# Patient Record
Sex: Female | Born: 2000 | Race: Black or African American | Hispanic: No | Marital: Single | Smoking: Never smoker
Health system: Southern US, Community
[De-identification: ages and names within clinical notes are randomized; demographics above are authoritative.]

## PROBLEM LIST (undated history)

## (undated) DIAGNOSIS — T7840XA Allergy, unspecified, initial encounter: Secondary | ICD-10-CM

## (undated) DIAGNOSIS — E559 Vitamin D deficiency, unspecified: Secondary | ICD-10-CM

## (undated) DIAGNOSIS — F419 Anxiety disorder, unspecified: Secondary | ICD-10-CM

## (undated) DIAGNOSIS — L309 Dermatitis, unspecified: Secondary | ICD-10-CM

## (undated) DIAGNOSIS — Z9109 Other allergy status, other than to drugs and biological substances: Secondary | ICD-10-CM

## (undated) DIAGNOSIS — B009 Herpesviral infection, unspecified: Secondary | ICD-10-CM

## (undated) HISTORY — DX: Herpesviral infection, unspecified: B00.9

## (undated) HISTORY — DX: Anxiety disorder, unspecified: F41.9

## (undated) HISTORY — DX: Vitamin D deficiency, unspecified: E55.9

## (undated) HISTORY — DX: Allergy, unspecified, initial encounter: T78.40XA

---
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
## 2021-07-02 NOTE — Progress Notes (Signed)
New Patient Office Visit  Subjective:  Patient ID: Marie Wilcox, female    DOB: May 27, 2001  Age: 21 y.o. MRN: [id number]  CC:  Chief Complaint  Patient presents with   Establish Care    Pt is fasting, pt would like a physcial with blood, pt does not have an ob     HPI Marie Wilcox presents for new patient visit to establish care.  Introduced to Designer, jewellery role and practice setting.  All questions answered.  Discussed provider/patient relationship and expectations. She has no concerns today, other than wanting a physical with her labs updated.   Depression screen [facility] 2/9 07/03/2021  Decreased Interest 0  Down, Depressed, Hopeless 0  PHQ - 2 Score 0  Altered sleeping 0  Tired, decreased energy 0  Change in appetite 0  Feeling bad or failure about yourself  0  Trouble concentrating 0  Moving slowly or fidgety/restless 0  Suicidal thoughts 0  PHQ-9 Score 0  Difficult doing work/chores Not difficult at all   GAD 7 : Generalized Anxiety Score 07/03/2021  Nervous, Anxious, on Edge 0  Control/stop worrying 0  Worry too much - different things 0  Trouble relaxing 0  Restless 0  Easily annoyed or irritable 0  Afraid - awful might happen 0  Total GAD 7 Score 0  Anxiety Difficulty Not difficult at all    Past Medical History:  Diagnosis Date   Eczema    Environmental allergies    HSV-1 infection    Vitamin D deficiency     History reviewed. No pertinent surgical history.  Family History  Problem Relation Age of Onset   Hypertension Mother    Cancer Father        uterocarcinoma   Stroke Maternal Grandfather    Cancer Maternal Grandfather        lung cancer    Social History   Socioeconomic History   Marital status: Single    Spouse name: Not on file   Number of children: Not on file   Years of education: Not on file   Highest education level: Not on file  Occupational History   Not on file  Tobacco Use   Smoking status: Never   Smokeless tobacco:  Not on file  Vaping Use   Vaping Use: Former  Substance and Sexual Activity   Alcohol use: Yes    Comment: 1 glass wine every few weeks   Drug use: Not Currently    Types: Marijuana   Sexual activity: Not Currently    Birth control/protection: None  Other Topics Concern   Not on file  Social History Narrative   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   HENT: Negative.    Eyes: Negative.   Respiratory: Negative.    Cardiovascular: Negative.   Gastrointestinal: Negative.   Endocrine: Negative.   Genitourinary: Negative.   Musculoskeletal: Negative.   Skin: Negative.   Allergic/Immunologic: Positive for environmental allergies. Negative for food allergies and immunocompromised state.  Neurological: Negative.   Psychiatric/Behavioral: Negative.     Objective:   Today's Vitals:  BP 110/74 (BP Location: Left Arm, Patient Position: Sitting, Cuff Size: Normal)    Pulse 70    Temp 97.8 F (36.6 C) (Temporal)    Resp 18    Ht 5' 4.5" (1.638 m)    Wt 159 lb 3.2 oz (72.2 kg)    SpO2 99%    BMI 26.90 kg/m   Physical Exam Vitals and nursing note reviewed.  Constitutional:      General: She is not in acute distress.    Appearance: Normal appearance.  HENT:     Head: Normocephalic and atraumatic.     Right Ear: Tympanic membrane, ear canal and external ear normal.     Left Ear: Tympanic membrane, ear canal and external ear normal.     Nose: Nose normal.     Mouth/Throat:     Mouth: Mucous membranes are moist.     Pharynx: Oropharynx is clear.  Eyes:     Conjunctiva/sclera: Conjunctivae normal.  Cardiovascular:     Rate and  Rhythm: Normal rate and regular rhythm.     Pulses: Normal pulses.     Heart sounds: Normal heart sounds.  Pulmonary:     Effort: Pulmonary effort is normal.     Breath sounds: Normal breath sounds.  Abdominal:     General: Bowel sounds are normal.     Palpations: Abdomen is soft.     Tenderness: There is no abdominal tenderness.  Musculoskeletal:        General: Normal range of motion.     Cervical back: Normal range of motion.     Comments: Strength 5/5 in bilateral upper and lower extremities   Skin:    General: Skin is warm and dry.  Neurological:     General: No focal deficit present.     Mental Status: She is alert and oriented to person, place, and time.     Cranial Nerves: No cranial nerve deficit.     Coordination: Coordination normal.     Gait: Gait normal.  Psychiatric:        Mood and Affect: Mood normal.        Behavior: Behavior normal.        Thought Content: Thought content normal.        Judgment: Judgment normal.    Assessment & Plan:   Problem List Items Addressed This Visit       Musculoskeletal and Integument   Eczema    Chronic, well controlled. She uses African black soap which controls her symptoms. Encouraged her to reach out with any concerns.         Other   Vitamin D deficiency    History of low vitamin D with supplementation. She is currently not taking any supplements for vitamin D. Will check vitamin D level today.       Relevant Orders   VITAMIN D 25 Hydroxy (Vit-D Deficiency, Fractures)   HSV-1 infection    History of cold sores, she has an expired prescription for valtrex 2,000mg  BID x1 day as needed. She has very rare outbreaks. Will send in a refill of valtrex since last prescription out of date. Follow up with any concerns.       Relevant Medications   valACYclovir (VALTREX) 1000 MG tablet   Other Visit Diagnoses     Routine general medical examination at a health care facility    -  Primary   Health maintenance reviewed  and updated. Check CMP, CBC, TSH today. Vaccines UTD  Relevant Orders   CBC with Differential/Platelet   Comprehensive metabolic panel   TSH   Screen for STD (sexually transmitted disease)       STD screen per patient request. Discussed safe sex and condom use.    Relevant Orders   Hepatitis C antibody   HIV Antibody (routine testing w rflx)   Urine cytology ancillary only   RPR   Encounter for lipid screening for cardiovascular disease       Baseline lipid panel done today   Encounter to establish care       Relevant Orders   Lipid panel      LABORATORY TESTING:  - Pap smear: not applicable  IMMUNIZATIONS:   - Tdap: Tetanus vaccination status reviewed: last tetanus booster within 10 years. - Influenza: Up to date - Pneumovax: Not applicable - Prevnar: Not applicable - HPV: Up to date - Zostavax vaccine: Not applicable  SCREENING: -Mammogram: Not applicable  - Colonoscopy: Not applicable  - Bone Density: Not applicable  -Hearing Test: Not applicable  -Spirometry: Not applicable   PATIENT COUNSELING:   Advised to take 1 mg of folate supplement per day if capable of pregnancy.   Sexuality: Discussed sexually transmitted diseases, partner selection, use of condoms, avoidance of unintended pregnancy  and contraceptive alternatives.   Advised to avoid cigarette smoking.  I discussed with the patient that most people either abstain from alcohol or drink within safe limits (<=14/week and <=4 drinks/occasion for males, <=7/weeks and <= 3 drinks/occasion for females) and that the risk for alcohol disorders and other health effects rises proportionally with the number of drinks per week and how often a drinker exceeds daily limits.  Discussed cessation/primary prevention of drug use and availability of treatment for abuse.   Diet: Encouraged to adjust caloric intake to maintain  or achieve ideal body weight, to reduce intake of dietary saturated fat and total fat, to limit  sodium intake by avoiding high sodium foods and not adding table salt, and to maintain adequate dietary potassium and calcium preferably from fresh fruits, vegetables, and low-fat dairy products.    stressed the importance of regular exercise  Injury prevention: Discussed safety belts, safety helmets, smoke detector, smoking near bedding or upholstery.   Dental health: Discussed importance of regular tooth brushing, flossing, and dental visits.    NEXT PREVENTATIVE PHYSICAL DUE IN 1 YEAR.  Outpatient Encounter Medications as of 07/03/2021  Medication Sig   [DISCONTINUED] valACYclovir (VALTREX) 1000 MG tablet Take 1,000 mg by mouth 2 (two) times daily as needed (cold sore). For 1 day prn outbreak   valACYclovir (VALTREX) 1000 MG tablet Take 2 tablets (2,000 mg total) by mouth 2 (two) times daily as needed (cold sore). For 1 day prn outbreak   [DISCONTINUED] cetirizine (ZYRTEC) 10 MG tablet Take 10 mg by mouth daily. (Patient not taking: Reported on 07/03/2021)   [DISCONTINUED] fluticasone (FLONASE) 50 MCG/ACT nasal spray Place 1 spray into both nostrils daily. (Patient not taking: Reported on 07/03/2021)   [DISCONTINUED] naproxen (NAPROSYN) 500 MG tablet Take 1 tablet (500 mg total) by mouth 2 (two) times daily. (Patient not taking: Reported on 07/03/2021)   [DISCONTINUED] Vitamin D, Ergocalciferol, (DRISDOL) 50000 UNITS CAPS capsule Take 50,000 Units by mouth every 7 (seven) days. (Patient not taking: Reported on 07/03/2021)   No facility-administered encounter medications on file as of 07/03/2021.    Follow-up: Return in about 1 year (around 07/03/2022) for CPE.   Charyl Dancer, NP

## 2021-07-03 ENCOUNTER — Ambulatory Visit (INDEPENDENT_AMBULATORY_CARE_PROVIDER_SITE_OTHER): Payer: 59 | Admitting: Nurse Practitioner

## 2021-07-03 ENCOUNTER — Other Ambulatory Visit (HOSPITAL_COMMUNITY)
Admission: RE | Admit: 2021-07-03 | Discharge: 2021-07-03 | Disposition: A | Discharge status: Home / Self Care | Payer: 59 | Source: Ambulatory Visit | Attending: Nurse Practitioner | Admitting: Nurse Practitioner

## 2021-07-03 ENCOUNTER — Other Ambulatory Visit: Payer: Self-pay

## 2021-07-03 ENCOUNTER — Encounter: Payer: Self-pay | Admitting: Nurse Practitioner

## 2021-07-03 VITALS — BP 110/74 | HR 70 | Temp 97.8°F | Resp 18 | Ht 64.5 in | Wt 159.2 lb

## 2021-07-03 DIAGNOSIS — Z13220 Encounter for screening for lipoid disorders: Secondary | ICD-10-CM

## 2021-07-03 DIAGNOSIS — B009 Herpesviral infection, unspecified: Secondary | ICD-10-CM | POA: Diagnosis not present

## 2021-07-03 DIAGNOSIS — Z113 Encounter for screening for infections with a predominantly sexual mode of transmission: Secondary | ICD-10-CM | POA: Insufficient documentation

## 2021-07-03 DIAGNOSIS — L309 Dermatitis, unspecified: Secondary | ICD-10-CM | POA: Diagnosis not present

## 2021-07-03 DIAGNOSIS — E559 Vitamin D deficiency, unspecified: Secondary | ICD-10-CM | POA: Insufficient documentation

## 2021-07-03 DIAGNOSIS — Z7689 Persons encountering health services in other specified circumstances: Secondary | ICD-10-CM

## 2021-07-03 DIAGNOSIS — Z0000 Encounter for general adult medical examination without abnormal findings: Secondary | ICD-10-CM | POA: Diagnosis not present

## 2021-07-03 DIAGNOSIS — Z136 Encounter for screening for cardiovascular disorders: Secondary | ICD-10-CM

## 2021-07-03 LAB — CBC WITH DIFFERENTIAL/PLATELET
Basophils Absolute: 0 10*3/uL (ref 0.0–0.1)
Basophils Relative: 0.8 % (ref 0.0–3.0)
Eosinophils Absolute: 0.2 10*3/uL (ref 0.0–0.7)
Eosinophils Relative: 4.1 % (ref 0.0–5.0)
HCT: 36.5 % (ref 36.0–46.0)
Hemoglobin: 11.8 g/dL — ABNORMAL LOW (ref 12.0–15.0)
Lymphocytes Relative: 32.8 % (ref 12.0–46.0)
Lymphs Abs: 1.5 10*3/uL (ref 0.7–4.0)
MCHC: 32.4 g/dL (ref 30.0–36.0)
MCV: 85.7 fl (ref 78.0–100.0)
Monocytes Absolute: 0.5 10*3/uL (ref 0.1–1.0)
Monocytes Relative: 10.8 % (ref 3.0–12.0)
Neutro Abs: 2.4 10*3/uL (ref 1.4–7.7)
Neutrophils Relative %: 51.5 % (ref 43.0–77.0)
Platelets: 334 10*3/uL (ref 150.0–400.0)
RBC: 4.26 Mil/uL (ref 3.87–5.11)
RDW: 12.4 % (ref 11.5–14.6)
WBC: 4.7 10*3/uL (ref 4.5–10.5)

## 2021-07-03 LAB — LIPID PANEL
Cholesterol: 192 mg/dL (ref 0–200)
HDL: 57.4 mg/dL (ref >39.00)
LDL Cholesterol: 128 mg/dL — ABNORMAL HIGH (ref 0–99)
NonHDL: 134.59
Total CHOL/HDL Ratio: 3
Triglycerides: 33 mg/dL (ref 0.0–149.0)
VLDL: 6.6 mg/dL (ref 0.0–40.0)

## 2021-07-03 LAB — COMPREHENSIVE METABOLIC PANEL
ALT: 9 U/L (ref 0–35)
AST: 13 U/L (ref 0–37)
Albumin: 4.3 g/dL (ref 3.5–5.2)
Alkaline Phosphatase: 66 U/L (ref 39–117)
BUN: 13 mg/dL (ref 6–23)
CO2: 26 mEq/L (ref 19–32)
Calcium: 9.7 mg/dL (ref 8.4–10.5)
Chloride: 106 mEq/L (ref 96–112)
Creatinine, Ser: 0.79 mg/dL (ref 0.40–1.20)
GFR: 107.34 mL/min (ref >60.00)
Glucose, Bld: 83 mg/dL (ref 70–99)
Potassium: 4 mEq/L (ref 3.5–5.1)
Sodium: 139 mEq/L (ref 135–145)
Total Bilirubin: 0.4 mg/dL (ref 0.2–1.2)
Total Protein: 7.7 g/dL (ref 6.0–8.3)

## 2021-07-03 LAB — VITAMIN D 25 HYDROXY (VIT D DEFICIENCY, FRACTURES): VITD: 17.15 ng/mL — ABNORMAL LOW (ref 30.00–100.00)

## 2021-07-03 LAB — TSH: TSH: 2.01 u[IU]/mL (ref 0.35–5.50)

## 2021-07-03 MED ORDER — VALACYCLOVIR HCL 1 G PO TABS: 2000.0000 mg | ORAL_TABLET | Freq: Two times a day (BID) | ORAL | 1 refills | Status: DC | PRN

## 2021-07-03 NOTE — Assessment & Plan Note (Signed)
History of low vitamin D with supplementation. She is currently not taking any supplements for vitamin D. Will check vitamin D level today.

## 2021-07-03 NOTE — Assessment & Plan Note (Signed)
History of cold sores, she has an expired prescription for valtrex 2,000mg  BID x1 day as needed. She has very rare outbreaks. Will send in a refill of valtrex since last prescription out of date. Follow up with any concerns.

## 2021-07-03 NOTE — Patient Instructions (Signed)
It was great to see you!  I have refilled your valtrex as needed.   We will check your labs and call you with the results, some may take longer than others which is normal.   Let's follow-up in 1 year, sooner if you have concerns.  If a referral was placed today, you will be contacted for an appointment. Please note that routine referrals can sometimes take up to 3-4 weeks to process. Please call our office if you haven't heard anything after this time frame.  Take care,  Rodman Pickle, NP

## 2021-07-03 NOTE — Assessment & Plan Note (Signed)
Chronic, well controlled. She uses African black soap which controls her symptoms. Encouraged her to reach out with any concerns.

## 2021-07-04 LAB — URINE CYTOLOGY ANCILLARY ONLY
Chlamydia: NEGATIVE
Comment: NEGATIVE
Comment: NORMAL
Neisseria Gonorrhea: NEGATIVE

## 2021-07-04 LAB — HEPATITIS C ANTIBODY
Hepatitis C Ab: NONREACTIVE
SIGNAL TO CUT-OFF: 0.06 (ref <1.00)

## 2021-07-04 LAB — HIV ANTIBODY (ROUTINE TESTING W REFLEX): HIV 1&2 Ab, 4th Generation: NONREACTIVE

## 2021-07-04 LAB — RPR: RPR Ser Ql: NONREACTIVE

## 2021-07-04 MED ORDER — VITAMIN D (ERGOCALCIFEROL) 1.25 MG (50000 UNIT) PO CAPS: 50000.0000 [IU] | ORAL_CAPSULE | ORAL | 0 refills | Status: DC

## 2021-07-04 NOTE — Addendum Note (Signed)
Addended by: Rodman Pickle A on: 07/04/2021 04:25 PM   Modules accepted: Orders

## 2021-07-29 ENCOUNTER — Encounter: Payer: Self-pay | Admitting: Nurse Practitioner

## 2021-10-11 ENCOUNTER — Ambulatory Visit: Payer: 59 | Admitting: Nurse Practitioner

## 2021-10-25 ENCOUNTER — Telehealth: Payer: Self-pay | Admitting: Nurse Practitioner

## 2021-10-25 NOTE — Telephone Encounter (Signed)
Pt brought a physical form to be filled out for school it is in your folder.

## 2021-10-29 ENCOUNTER — Telehealth: Payer: Self-pay

## 2021-10-29 NOTE — Telephone Encounter (Signed)
Scheduled pt NV for PPD test

## 2021-10-30 ENCOUNTER — Ambulatory Visit (INDEPENDENT_AMBULATORY_CARE_PROVIDER_SITE_OTHER): Payer: 59

## 2021-10-30 DIAGNOSIS — Z111 Encounter for screening for respiratory tuberculosis: Secondary | ICD-10-CM

## 2021-10-30 NOTE — Progress Notes (Signed)
Administered in left Forearm, pt to return on 11/01/21 after 8am for reading.

## 2021-11-01 ENCOUNTER — Ambulatory Visit: Payer: 59

## 2021-11-05 ENCOUNTER — Ambulatory Visit: Payer: 59
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
## 2021-12-13 ENCOUNTER — Telehealth: Payer: Self-pay | Admitting: Nurse Practitioner

## 2021-12-13 NOTE — Telephone Encounter (Signed)
Pt walked in to pick up paperwork, after waiting for a little while she said she had to leave and requested that we call once the paperwork is ready for pickup.

## 2021-12-16 NOTE — Telephone Encounter (Signed)
PAPERWORK COMPLETE AND AT FRONT DESK.  

## 2022-03-07 ENCOUNTER — Encounter: Payer: Self-pay | Admitting: Nurse Practitioner

## 2022-03-10 ENCOUNTER — Telehealth: Payer: Self-pay | Admitting: Nurse Practitioner

## 2022-03-10 NOTE — Telephone Encounter (Signed)
Caller Name: Laural Eiland Call back phone #: [phone]  Reason for Call: Pt is on tomorrow's schedule (10/03) for issues with her left foot. She requested a MyChart after I advised for her to come in person. Her work schedule is 8:30-5:30, is this appointment time okay so we can work with her schedule.

## 2022-03-11 ENCOUNTER — Telehealth (INDEPENDENT_AMBULATORY_CARE_PROVIDER_SITE_OTHER): Payer: 59 | Admitting: Nurse Practitioner

## 2022-03-11 ENCOUNTER — Encounter: Payer: Self-pay | Admitting: Nurse Practitioner

## 2022-03-11 DIAGNOSIS — E559 Vitamin D deficiency, unspecified: Secondary | ICD-10-CM | POA: Diagnosis not present

## 2022-03-11 DIAGNOSIS — D649 Anemia, unspecified: Secondary | ICD-10-CM | POA: Diagnosis not present

## 2022-03-11 DIAGNOSIS — R6889 Other general symptoms and signs: Secondary | ICD-10-CM | POA: Insufficient documentation

## 2022-03-11 NOTE — Progress Notes (Signed)
[facility] [address] [place] [address] Dept: [phone] Dept Fax: [phone]  Virtual Video Visit  I connected with Marie Wilcox on 03/11/22 at  8:00 AM EDT by a video enabled telemedicine application and verified that I am speaking with the correct person using two identifiers.  Location patient: Home Location provider: Clinic Persons participating in the virtual visit: Patient; Vance Peper, NP; Marchia Bond, CMA  I discussed the limitations of evaluation and management by telemedicine and the availability of in person appointments. The patient expressed understanding and agreed to proceed.  Chief Complaint  Patient presents with   Acute Visit    Pt c/o random heat sensations in LT foot x1 wk    SUBJECTIVE:  HPI: Marie Wilcox is a 21 y.o. female who presents with random heat sensations in her left foot for the last week to 2. She denies pain, tingling, or numbness. She states that it lasts a few seconds and then goes away. This is happening throughout the day. Unsure of triggering factors. She first thought it was her shoes and switched, however it didn't help. She denies rashes, polyuria, polydipsia, changes in vision, sensitivity to heat/cold. She states that this happens more frequently at night and in the afternoon. She is on her feet for work, does not wear compression socks.   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 No pertinent surgical history.  Family History  Problem Relation Age of Onset   Hypertension Mother    Cancer Father        uterocarcinoma   Stroke Maternal Grandfather    Cancer Maternal Grandfather        lung cancer    Social History   Tobacco Use   Smoking status: Never  Vaping Use   Vaping Use: Former  Substance Use  Topics   Alcohol use: Yes    Comment: 1 glass wine every few weeks   Drug use: Not Currently    Types: Marijuana     Current Outpatient Medications:    valACYclovir (VALTREX) 1000 MG tablet, Take 2 tablets (2,000 mg total) by mouth 2 (two) times daily as needed (cold sore). For 1 day prn outbreak, Disp: 12 tablet, Rfl: 1   Vitamin D, Ergocalciferol, (DRISDOL) 1.25 MG (50000 UNIT) CAPS capsule, Take 1 capsule (50,000 Units total) by mouth every 7 (seven) days., Disp: 12 capsule, Rfl: 0  No Known Allergies  ROS: See pertinent positives and negatives per HPI.  OBSERVATIONS/OBJECTIVE:  VITALS per patient if applicable: There were no vitals filed for this visit. There is no height or weight on file to calculate BMI.    GENERAL: Alert and oriented. Appears well and in no acute distress.  HEENT: Atraumatic. Conjunctiva clear. No obvious abnormalities on inspection of external nose and ears.  NECK: Normal movements of the head and neck.  LUNGS: On inspection, no signs of respiratory distress. Breathing rate appears normal. No obvious gross SOB, gasping or wheezing, and no conversational dyspnea.  CV: No obvious cyanosis.  MS: Moves all visible extremities without noticeable abnormality.  PSYCH/NEURO: Pleasant and cooperative. No obvious depression or anxiety. Speech and thought processing grossly intact.  ASSESSMENT AND PLAN:  Problem List Items Addressed This Visit       Other   Vitamin D deficiency  She is currently taking an OTC vitamin D supplement. Will check vitamin D levels and adjust regimen based on results.       Relevant Orders   VITAMIN D 25 Hydroxy (Vit-D Deficiency, Fractures)   Sensation of feeling hot - Primary    She has been having hot flash sensations in her left foot that have increased in frequency over the past week.  They tend to happen at night and in the evening.  She tried changing shoes without relief.  No rashes per patient.  She can try wearing  compression socks while at work, vitamin B12 supplement daily 1000 mcg, and making sure that she is taking her multivitamin with iron.  We will check labs: BMP, CBC, TSH, vitamin B12, A1c.  Follow-up if symptoms do not improve or worsen.      Relevant Orders   TSH   Hemoglobin A1c   Basic metabolic panel   Other Visit Diagnoses     Anemia, unspecified type       Last hemoglobin was slightly low.  We will recheck CBC, iron panel, vitamin B12 today.   Relevant Orders   CBC   Iron, TIBC and Ferritin Panel   Vitamin B12        I discussed the assessment and treatment plan with the patient. The patient was provided an opportunity to ask questions and all were answered. The patient agreed with the plan and demonstrated an understanding of the instructions.   The patient was advised to call back or seek an in-person evaluation if the symptoms worsen or if the condition fails to improve as anticipated.   Gerre Scull, NP

## 2022-03-11 NOTE — Patient Instructions (Signed)
It was great to see you!  Call [phone] to schedule a lab only visit.   You can re-start your multivitamin with iron and start a vitamin B12 supplement 1,080mcg daily. You can also wear compression socks to work.   Let's follow-up if your symptoms worsen or don't improve.   Take care,  Vance Peper, NP

## 2022-03-11 NOTE — Assessment & Plan Note (Addendum)
She has been having hot flash sensations in her left foot that have increased in frequency over the past week.  They tend to happen at night and in the evening.  She tried changing shoes without relief.  No rashes per patient.  She can try wearing compression socks while at work, vitamin B12 supplement daily 1000 mcg, and making sure that she is taking her multivitamin with iron.  We will check labs: BMP, CBC, TSH, vitamin B12, A1c.  Follow-up if symptoms do not improve or worsen.

## 2022-03-11 NOTE — Assessment & Plan Note (Signed)
She is currently taking an OTC vitamin D supplement. Will check vitamin D levels and adjust regimen based on results.

## 2022-03-17 ENCOUNTER — Other Ambulatory Visit (INDEPENDENT_AMBULATORY_CARE_PROVIDER_SITE_OTHER): Payer: 59

## 2022-03-17 DIAGNOSIS — D649 Anemia, unspecified: Secondary | ICD-10-CM | POA: Diagnosis not present

## 2022-03-17 DIAGNOSIS — R6889 Other general symptoms and signs: Secondary | ICD-10-CM | POA: Diagnosis not present

## 2022-03-17 DIAGNOSIS — E559 Vitamin D deficiency, unspecified: Secondary | ICD-10-CM | POA: Diagnosis not present

## 2022-03-17 LAB — VITAMIN D 25 HYDROXY (VIT D DEFICIENCY, FRACTURES): VITD: 16.06 ng/mL — ABNORMAL LOW (ref 30.00–100.00)

## 2022-03-17 LAB — CBC
HCT: 34.3 % — ABNORMAL LOW (ref 36.0–46.0)
Hemoglobin: 11.3 g/dL — ABNORMAL LOW (ref 12.0–15.0)
MCHC: 33 g/dL (ref 30.0–36.0)
MCV: 86.4 fl (ref 78.0–100.0)
Platelets: 257 10*3/uL (ref 150.0–400.0)
RBC: 3.97 Mil/uL (ref 3.87–5.11)
RDW: 12.7 % (ref 11.5–15.5)
WBC: 5.4 10*3/uL (ref 4.0–10.5)

## 2022-03-17 LAB — BASIC METABOLIC PANEL
BUN: 11 mg/dL (ref 6–23)
CO2: 23 mEq/L (ref 19–32)
Calcium: 9 mg/dL (ref 8.4–10.5)
Chloride: 107 mEq/L (ref 96–112)
Creatinine, Ser: 0.66 mg/dL (ref 0.40–1.20)
GFR: 125.26 mL/min (ref >60.00)
Glucose, Bld: 91 mg/dL (ref 70–99)
Potassium: 3.9 mEq/L (ref 3.5–5.1)
Sodium: 139 mEq/L (ref 135–145)

## 2022-03-17 LAB — HEMOGLOBIN A1C: Hgb A1c MFr Bld: 5.1 % (ref 4.6–6.5)

## 2022-03-17 LAB — VITAMIN B12: Vitamin B-12: 323 pg/mL (ref 211–911)

## 2022-03-17 LAB — TSH: TSH: 2.34 u[IU]/mL (ref 0.35–5.50)

## 2022-03-18 LAB — IRON,TIBC AND FERRITIN PANEL
%SAT: 24 % (calc) (ref 16–45)
Ferritin: 29 ng/mL (ref 16–154)
Iron: 75 ug/dL (ref 40–190)
TIBC: 308 mcg/dL (calc) (ref 250–450)

## 2022-03-18 MED ORDER — VITAMIN D (ERGOCALCIFEROL) 1.25 MG (50000 UNIT) PO CAPS: 50000.0000 [IU] | ORAL_CAPSULE | ORAL | 0 refills | Status: DC

## 2022-03-18 NOTE — Addendum Note (Signed)
Addended by: Vance Peper A on: 03/18/2022 11:24 AM   Modules accepted: Orders

## 2022-07-04 ENCOUNTER — Encounter: Payer: Medicaid Other | Admitting: Nurse Practitioner

## 2022-07-17 ENCOUNTER — Encounter: Payer: Self-pay | Admitting: Nurse Practitioner

## 2022-08-15 ENCOUNTER — Ambulatory Visit: Payer: Commercial Managed Care - PPO | Admitting: Nurse Practitioner

## 2022-08-19 ENCOUNTER — Telehealth: Payer: Self-pay | Admitting: Nurse Practitioner

## 2022-08-19 ENCOUNTER — Ambulatory Visit: Payer: Commercial Managed Care - PPO | Admitting: Nurse Practitioner

## 2022-08-19 NOTE — Telephone Encounter (Signed)
1st no show, fee waived, letter sent to reschedule/notify of policy 

## 2022-08-19 NOTE — Telephone Encounter (Signed)
Pt was a no show for an OV with Lauren, I sent a no show letter.

## 2022-08-20 NOTE — Telephone Encounter (Signed)
Noted  

## 2022-09-08 ENCOUNTER — Encounter: Payer: Self-pay | Admitting: Nurse Practitioner

## 2022-10-17 ENCOUNTER — Encounter: Payer: Self-pay | Admitting: Nurse Practitioner

## 2022-10-17 ENCOUNTER — Ambulatory Visit (INDEPENDENT_AMBULATORY_CARE_PROVIDER_SITE_OTHER): Payer: Commercial Managed Care - PPO | Admitting: Nurse Practitioner

## 2022-10-17 VITALS — BP 112/78 | HR 89 | Temp 97.1°F | Ht 64.5 in | Wt 181.6 lb

## 2022-10-17 DIAGNOSIS — Z124 Encounter for screening for malignant neoplasm of cervix: Secondary | ICD-10-CM

## 2022-10-17 DIAGNOSIS — E7800 Pure hypercholesterolemia, unspecified: Secondary | ICD-10-CM

## 2022-10-17 DIAGNOSIS — L731 Pseudofolliculitis barbae: Secondary | ICD-10-CM | POA: Diagnosis not present

## 2022-10-17 DIAGNOSIS — E559 Vitamin D deficiency, unspecified: Secondary | ICD-10-CM | POA: Diagnosis not present

## 2022-10-17 DIAGNOSIS — B009 Herpesviral infection, unspecified: Secondary | ICD-10-CM

## 2022-10-17 DIAGNOSIS — Z0000 Encounter for general adult medical examination without abnormal findings: Secondary | ICD-10-CM

## 2022-10-17 DIAGNOSIS — D649 Anemia, unspecified: Secondary | ICD-10-CM | POA: Diagnosis not present

## 2022-10-17 LAB — CBC
HCT: 37.5 % (ref 36.0–46.0)
Hemoglobin: 12.8 g/dL (ref 12.0–15.0)
MCHC: 34 g/dL (ref 30.0–36.0)
MCV: 85.5 fl (ref 78.0–100.0)
Platelets: 302 10*3/uL (ref 150.0–400.0)
RBC: 4.39 Mil/uL (ref 3.87–5.11)
RDW: 12.4 % (ref 11.5–15.5)
WBC: 5.5 10*3/uL (ref 4.0–10.5)

## 2022-10-17 LAB — LIPID PANEL
Cholesterol: 216 mg/dL — ABNORMAL HIGH (ref 0–200)
HDL: 67.1 mg/dL (ref >39.00)
LDL Cholesterol: 141 mg/dL — ABNORMAL HIGH (ref 0–99)
NonHDL: 149.36
Total CHOL/HDL Ratio: 3
Triglycerides: 40 mg/dL (ref 0.0–149.0)
VLDL: 8 mg/dL (ref 0.0–40.0)

## 2022-10-17 MED ORDER — VALACYCLOVIR HCL 1 G PO TABS: 2000.0000 mg | ORAL_TABLET | Freq: Two times a day (BID) | ORAL | 1 refills | Status: DC | PRN

## 2022-10-17 NOTE — Progress Notes (Signed)
BP 112/78 (BP Location: Left Arm)   Pulse 89   Temp (!) 97.1 F (36.2 C)   Ht 5' 4.5" (1.638 m)   Wt 181 lb 9.6 oz (82.4 kg)   LMP 10/04/2022 (Exact Date)   SpO2 99%   BMI 30.69 kg/m    Subjective:    Patient ID: Marie Wilcox, female    DOB: 01-16-2001, 22 y.o.   MRN: [id number]  CC: Chief Complaint  Patient presents with   Annual Exam    With fasting labs, concerns with knots under right armpit, Rx refill    HPI: Marie Wilcox is a 22 y.o. female presenting on 10/17/2022 for comprehensive medical examination. Current medical complaints include: knots under right armpit  She states that the she noticed several knots under her right axilla on Sunday. There was one big one and 3 small ones. They are were painful. The 3 small ones went away, and the bigger one has gotten smaller. She denies fevers and drainage.   She currently lives with: alone Menopausal Symptoms: no  Depression and Anxiety Screen done today and results listed below:     10/17/2022   10:46 AM 07/03/2021    8:14 AM  Depression screen PHQ 2/9  Decreased Interest 0 0  Down, Depressed, Hopeless 0 0  PHQ - 2 Score 0 0  Altered sleeping 0 0  Tired, decreased energy 0 0  Change in appetite 1 0  Feeling bad or failure about yourself  0 0  Trouble concentrating 0 0  Moving slowly or fidgety/restless 0 0  Suicidal thoughts 0 0  PHQ-9 Score 1 0  Difficult doing work/chores Not difficult at all Not difficult at all      10/17/2022   10:47 AM 07/03/2021    8:15 AM  GAD 7 : Generalized Anxiety Score  Nervous, Anxious, on Edge 1 0  Control/stop worrying 0 0  Worry too much - different things 0 0  Trouble relaxing 0 0  Restless 0 0  Easily annoyed or irritable 0 0  Afraid - awful might happen 0 0  Total GAD 7 Score 1 0  Anxiety Difficulty Not difficult at all Not difficult at all    The patient does not have a history of falls. I did not complete a risk assessment for falls. A plan of care for falls was not  documented.   Past Medical History:  Past Medical History:  Diagnosis Date   Eczema    Environmental allergies    HSV-1 infection    Vitamin D deficiency     Surgical History:  History reviewed. No pertinent surgical history.  Medications:  Current Outpatient Medications on File Prior to Visit  Medication Sig   Vitamin D, Ergocalciferol, (DRISDOL) 1.25 MG (50000 UNIT) CAPS capsule Take 1 capsule (50,000 Units total) by mouth every 7 (seven) days.   No current facility-administered medications on file prior to visit.    Allergies:  No Known Allergies  Social History:  Social History   Socioeconomic History   Marital status: Single    Spouse name: Not on file   Number of children: Not on file   Years of education: Not on file   Highest education level: Not on file  Occupational History   Not on file  Tobacco Use   Smoking status: Never   Smokeless tobacco: Not on file  Vaping Use   Vaping Use: Former  Substance and Sexual Activity   Alcohol use: Yes  Comment: 1 glass wine every few weeks   Drug use: Not Currently    Types: Marijuana   Sexual activity: Never    Birth control/protection: None  Other Topics Concern   Not on file  Social History Narrative   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 [employer]   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 sister   Social Determinants of Health   Financial Resource Strain: Not on file  Food Insecurity: Not on file  Transportation Needs: Not on file  Physical Activity: Not on file  Stress: Not on file  Social Connections: Not on file  Intimate Partner Violence: Not on file   Social History   Tobacco Use  Smoking Status Never  Smokeless Tobacco Not on file   Social History   Substance and Sexual Activity  Alcohol Use Yes   Comment: 1 glass wine every few weeks    Family History:  Family History  Problem Relation Age of Onset   Cancer Mother        multiple myeloma   Hypertension Mother    Cancer Father         uterocarcinoma   Stroke Maternal Grandfather    Cancer Maternal Grandfather        lung cancer   Cancer Other        breast    Past medical history, surgical history, medications, allergies, family history and social history reviewed with patient today and changes made to appropriate areas of the chart.   Review of Systems  Constitutional: Negative.   HENT: Negative.    Eyes: Negative.   Respiratory: Negative.    Cardiovascular: Negative.   Gastrointestinal: Negative.   Genitourinary: Negative.   Musculoskeletal: Negative.   Skin:        Painful lump under right axilla  Neurological: Negative.   Psychiatric/Behavioral: Negative.     All other ROS negative except what is listed above and in the HPI.      Objective:    BP 112/78 (BP Location: Left Arm)   Pulse 89   Temp (!) 97.1 F (36.2 C)   Ht 5' 4.5" (1.638 m)   Wt 181 lb 9.6 oz (82.4 kg)   LMP 10/04/2022 (Exact Date)   SpO2 99%   BMI 30.69 kg/m   Wt Readings from Last 3 Encounters:  10/17/22 181 lb 9.6 oz (82.4 kg)  07/03/21 159 lb 3.2 oz (72.2 kg)  09/18/19 170 lb (77.1 kg) (92 %, Z= 1.42)*   * Growth percentiles are based on CDC (Girls, 2-20 Years) data.    Physical Exam Vitals and nursing note reviewed.  Constitutional:      General: She is not in acute distress.    Appearance: Normal appearance.  HENT:     Head: Normocephalic and atraumatic.     Right Ear: Tympanic membrane, ear canal and external ear normal.     Left Ear: Tympanic membrane, ear canal and external ear normal.  Eyes:     Conjunctiva/sclera: Conjunctivae normal.  Cardiovascular:     Rate and Rhythm: Normal rate and regular rhythm.     Pulses: Normal pulses.     Heart sounds: Normal heart sounds.  Pulmonary:     Effort: Pulmonary effort is normal.     Breath sounds: Normal breath sounds.  Abdominal:     Palpations: Abdomen is soft.     Tenderness: There is no abdominal tenderness.  Genitourinary:    General: Normal vulva.      Exam position: Lithotomy  position.     Labia:        Right: No rash, tenderness or lesion.        Left: No rash, tenderness or lesion.      Vagina: Normal.     Comments: Unable to tolerate speculum insertion Musculoskeletal:        General: Normal range of motion.     Cervical back: Normal range of motion and neck supple.     Right lower leg: No edema.     Left lower leg: No edema.  Lymphadenopathy:     Cervical: No cervical adenopathy.  Skin:    General: Skin is warm and dry.     Comments: Ingrown hair in right axilla  Neurological:     General: No focal deficit present.     Mental Status: She is alert and oriented to person, place, and time.     Cranial Nerves: No cranial nerve deficit.     Coordination: Coordination normal.     Gait: Gait normal.  Psychiatric:        Mood and Affect: Mood normal.        Behavior: Behavior normal.        Thought Content: Thought content normal.        Judgment: Judgment normal.     Results for orders placed or performed in visit on 03/17/22  Basic metabolic panel  Result Value Ref Range   Sodium 139 135 - 145 mEq/L   Potassium 3.9 3.5 - 5.1 mEq/L   Chloride 107 96 - 112 mEq/L   CO2 23 19 - 32 mEq/L   Glucose, Bld 91 70 - 99 mg/dL   BUN 11 6 - 23 mg/dL   Creatinine, Ser 5.28 0.40 - 1.20 mg/dL   GFR 413.24 >40.10 mL/min   Calcium 9.0 8.4 - 10.5 mg/dL  Hemoglobin U7O  Result Value Ref Range   Hgb A1c MFr Bld 5.1 4.6 - 6.5 %  TSH  Result Value Ref Range   TSH 2.34 0.35 - 5.50 uIU/mL  Vitamin B12  Result Value Ref Range   Vitamin B-12 323 211 - 911 pg/mL  VITAMIN D 25 Hydroxy (Vit-D Deficiency, Fractures)  Result Value Ref Range   VITD 16.06 (L) 30.00 - 100.00 ng/mL  Iron, TIBC and Ferritin Panel  Result Value Ref Range   Iron 75 40 - 190 mcg/dL   TIBC 536 644 - 034 mcg/dL (calc)   %SAT 24 16 - 45 % (calc)   Ferritin 29 16 - 154 ng/mL  CBC  Result Value Ref Range   WBC 5.4 4.0 - 10.5 K/uL   RBC 3.97 3.87 - 5.11 Mil/uL    Platelets 257.0 150.0 - 400.0 K/uL   Hemoglobin 11.3 (L) 12.0 - 15.0 g/dL   HCT 74.2 (L) 59.5 - 63.8 %   MCV 86.4 78.0 - 100.0 fl   MCHC 33.0 30.0 - 36.0 g/dL   RDW 75.6 43.3 - 29.5 %      Assessment & Plan:   Problem List Items Addressed This Visit       Other   Vitamin D deficiency    Check vitamin D levels today and adjust regimen based on results.       HSV-1 infection   Relevant Medications   valACYclovir (VALTREX) 1000 MG tablet   Routine general medical examination at a health care facility - Primary    Health maintenance reviewed and updated. Discussed nutrition, exercise. Follow-up 1 year.  Pure hypercholesterolemia    Check lipid panel today. Discussed nutrition, exercise.       Relevant Orders   Lipid panel   Other Visit Diagnoses     Anemia, unspecified type       Check CBC and iron panel today   Relevant Orders   CBC   Iron, TIBC and Ferritin Panel   Ingrown hair       Start warm compresses under right axilla 3-4 times per day. F/U if not improving or symptoms worsen.   Screening for cervical cancer       Unable to tolerate speculum insertion. Will discuss again at future visit.        Follow up plan: Return in about 1 year (around 10/17/2023) for CPE.   LABORATORY TESTING:  - Pap smear: pap done  IMMUNIZATIONS:   - Tdap: Tetanus vaccination status reviewed: last tetanus booster within 10 years. - Influenza: Postponed to flu season - Pneumovax: Not applicable - Prevnar: Not applicable - HPV: Up to date - Zostavax vaccine: Not applicable  SCREENING: -Mammogram: Not applicable  - Colonoscopy: Not applicable  - Bone Density: Not applicable   PATIENT COUNSELING:   Advised to take 1 mg of folate supplement per day if capable of pregnancy.   Sexuality: Discussed sexually transmitted diseases, partner selection, use of condoms, avoidance of unintended pregnancy  and contraceptive alternatives.   Advised to avoid cigarette  smoking.  I discussed with the patient that most people either abstain from alcohol or drink within safe limits (<=14/week and <=4 drinks/occasion for males, <=7/weeks and <= 3 drinks/occasion for females) and that the risk for alcohol disorders and other health effects rises proportionally with the number of drinks per week and how often a drinker exceeds daily limits.  Discussed cessation/primary prevention of drug use and availability of treatment for abuse.   Diet: Encouraged to adjust caloric intake to maintain  or achieve ideal body weight, to reduce intake of dietary saturated fat and total fat, to limit sodium intake by avoiding high sodium foods and not adding table salt, and to maintain adequate dietary potassium and calcium preferably from fresh fruits, vegetables, and low-fat dairy products.    stressed the importance of regular exercise  Injury prevention: Discussed safety belts, safety helmets, smoke detector, smoking near bedding or upholstery.   Dental health: Discussed importance of regular tooth brushing, flossing, and dental visits.    NEXT PREVENTATIVE PHYSICAL DUE IN 1 YEAR. Return in about 1 year (around 10/17/2023) for CPE.

## 2022-10-17 NOTE — Assessment & Plan Note (Signed)
Check lipid panel today. Discussed nutrition, exercise.  

## 2022-10-17 NOTE — Assessment & Plan Note (Signed)
Health maintenance reviewed and updated. Discussed nutrition, exercise. Follow-up 1 year.   

## 2022-10-17 NOTE — Patient Instructions (Addendum)
It was great to see you!  We are checking your labs today and will let you know the results via mychart/phone.   Use some warm compresses a few times a day under your right arm pit.   Let's follow-up in 1 year, sooner if you have concerns.  If a referral was placed today, you will be contacted for an appointment. Please note that routine referrals can sometimes take up to 3-4 weeks to process. Please call our office if you haven't heard anything after this time frame.  Take care,  Rodman Pickle, NP

## 2022-10-17 NOTE — Assessment & Plan Note (Signed)
Check vitamin D levels today and adjust regimen based on results.  

## 2022-10-18 LAB — IRON,TIBC AND FERRITIN PANEL
%SAT: 28 % (calc) (ref 16–45)
Ferritin: 29 ng/mL (ref 16–154)
Iron: 93 ug/dL (ref 40–190)
TIBC: 336 mcg/dL (calc) (ref 250–450)

## 2022-10-20 ENCOUNTER — Encounter: Payer: Commercial Managed Care - PPO | Admitting: Nurse Practitioner

## 2022-12-10 ENCOUNTER — Encounter: Payer: Self-pay | Admitting: Nurse Practitioner

## 2023-03-06 ENCOUNTER — Ambulatory Visit: Payer: Commercial Managed Care - PPO | Admitting: Nurse Practitioner

## 2023-03-06 ENCOUNTER — Other Ambulatory Visit (HOSPITAL_COMMUNITY): Payer: Self-pay

## 2023-03-06 ENCOUNTER — Other Ambulatory Visit (HOSPITAL_COMMUNITY)
Admission: RE | Admit: 2023-03-06 | Discharge: 2023-03-06 | Disposition: A | Discharge status: Home / Self Care | Payer: Commercial Managed Care - PPO | Source: Ambulatory Visit | Attending: Nurse Practitioner | Admitting: Nurse Practitioner

## 2023-03-06 ENCOUNTER — Encounter: Payer: Self-pay | Admitting: Nurse Practitioner

## 2023-03-06 VITALS — BP 100/72 | HR 90 | Temp 97.8°F | Ht 64.5 in | Wt 196.0 lb

## 2023-03-06 DIAGNOSIS — E669 Obesity, unspecified: Secondary | ICD-10-CM | POA: Diagnosis not present

## 2023-03-06 DIAGNOSIS — E7800 Pure hypercholesterolemia, unspecified: Secondary | ICD-10-CM | POA: Diagnosis not present

## 2023-03-06 DIAGNOSIS — E559 Vitamin D deficiency, unspecified: Secondary | ICD-10-CM

## 2023-03-06 DIAGNOSIS — Z113 Encounter for screening for infections with a predominantly sexual mode of transmission: Secondary | ICD-10-CM | POA: Insufficient documentation

## 2023-03-06 DIAGNOSIS — L731 Pseudofolliculitis barbae: Secondary | ICD-10-CM | POA: Diagnosis not present

## 2023-03-06 DIAGNOSIS — J302 Other seasonal allergic rhinitis: Secondary | ICD-10-CM | POA: Insufficient documentation

## 2023-03-06 DIAGNOSIS — Z23 Encounter for immunization: Secondary | ICD-10-CM | POA: Diagnosis not present

## 2023-03-06 LAB — LIPID PANEL
Cholesterol: 197 mg/dL (ref 0–200)
HDL: 62.1 mg/dL (ref >39.00)
LDL Cholesterol: 125 mg/dL — ABNORMAL HIGH (ref 0–99)
NonHDL: 134.66
Total CHOL/HDL Ratio: 3
Triglycerides: 49 mg/dL (ref 0.0–149.0)
VLDL: 9.8 mg/dL (ref 0.0–40.0)

## 2023-03-06 LAB — HEMOGLOBIN A1C: Hgb A1c MFr Bld: 5 % (ref 4.6–6.5)

## 2023-03-06 LAB — VITAMIN D 25 HYDROXY (VIT D DEFICIENCY, FRACTURES): VITD: 19.88 ng/mL — ABNORMAL LOW (ref 30.00–100.00)

## 2023-03-06 MED ORDER — VALACYCLOVIR HCL 1 G PO TABS
2000.0000 mg | ORAL_TABLET | Freq: Two times a day (BID) | ORAL | 1 refills | Status: DC | PRN
  Filled 2023-03-06: qty 12, 3d supply, fill #0
  Filled 2023-09-08: qty 12, 3d supply, fill #1

## 2023-03-06 MED ORDER — MONTELUKAST SODIUM 10 MG PO TABS
10.0000 mg | ORAL_TABLET | Freq: Every day | ORAL | 3 refills | Status: DC
  Filled 2023-03-06 – 2024-02-16 (×4): qty 30, 30d supply, fill #0

## 2023-03-06 MED ORDER — VITAMIN D (ERGOCALCIFEROL) 1.25 MG (50000 UNIT) PO CAPS
50000.0000 [IU] | ORAL_CAPSULE | ORAL | 1 refills | Status: DC
  Filled 2023-03-06 – 2023-10-05 (×3): qty 12, 84d supply, fill #0

## 2023-03-06 NOTE — Progress Notes (Signed)
Established Patient Office Visit  Subjective   Patient ID: Marie Wilcox, female    DOB: [birth year]  Age: 22 y.o. MRN: [id number]  Chief Complaint  Patient presents with   Medication Management    Rx refill, request lab work, STD Screen, Ingrown hairs under arms, flu vaccine    HPI  Marie Wilcox is here for STD screening and ingrown hairs under her arms.   She states that she has been frequently getting ingrown hairs under her arms after shaving.  She breaks out where she will cream but does use soap as a barrier.  She also does shave in the shower.  She notes that the last time that she had an ingrown hair, she used warm compresses and it was able to drain on its own.  She denies fevers and any active ingrown hairs.  She is interested in routine STD screening today.  She does not have any symptoms.  She also notes that she has seasonal allergies, especially with the change in weather.  She has been experiencing some nasal congestion and cough.  She states that she has taken Flonase and cetirizine in the past which did not help.  She is wondering if there is anything else that she can try.    ROS See pertinent positives and negatives per HPI.    Objective:     BP 100/72 (BP Location: Left Arm)   Pulse 90   Temp 97.8 F (36.6 C)   Ht 5' 4.5" (1.638 m)   Wt 196 lb (88.9 kg)   LMP 02/22/2023 (Exact Date)   SpO2 99%   BMI 33.12 kg/m  BP Readings from Last 3 Encounters:  03/06/23 100/72  10/17/22 112/78  07/03/21 110/74   Wt Readings from Last 3 Encounters:  03/06/23 196 lb (88.9 kg)  10/17/22 181 lb 9.6 oz (82.4 kg)  07/03/21 159 lb 3.2 oz (72.2 kg)      Physical Exam Vitals and nursing note reviewed.  Constitutional:      General: She is not in acute distress.    Appearance: Normal appearance.  HENT:     Head: Normocephalic.  Eyes:     Conjunctiva/sclera: Conjunctivae normal.  Cardiovascular:     Rate and Rhythm: Normal rate and regular rhythm.     Pulses:  Normal pulses.     Heart sounds: Normal heart sounds.  Pulmonary:     Effort: Pulmonary effort is normal.     Breath sounds: Normal breath sounds.  Musculoskeletal:     Cervical back: Normal range of motion.  Skin:    General: Skin is warm.  Neurological:     General: No focal deficit present.     Mental Status: She is alert and oriented to person, place, and time.  Psychiatric:        Mood and Affect: Mood normal.        Behavior: Behavior normal.        Thought Content: Thought content normal.        Judgment: Judgment normal.      Assessment & Plan:   Problem List Items Addressed This Visit       Other   Vitamin D deficiency    Will check vitamin D levels and treat based on results.       Relevant Orders   VITAMIN D 25 Hydroxy (Vit-D Deficiency, Fractures)   Pure hypercholesterolemia    She has been limiting eating out.  She would like her lipid  panel rechecked today, order placed.      Relevant Orders   Lipid panel   Seasonal allergies    She has been experiencing seasonal allergies, especially with the change in weather.  Will have her start Malatesta milligrams daily.  She can also continue Flonase and cetirizine 10 mg daily as needed.  Discussed that these medications work best if she has been taking them daily for a few weeks.  She can start taking them about a month before her symptoms typically happen.  Follow-up with any concerns.      Obesity (BMI 30-39.9)    BMI 33.1.  She states that this is the heaviest she has been.  Discussed nutrition, exercise.  Will check A1c today.      Relevant Orders   Hemoglobin A1c   Other Visit Diagnoses     Ingrown hair    -  Primary   Continue using soap with shaving. Shave in the direction of hair growth, use lotion after shaving. Use warm compresses with any ingrown hairs.   Screen for STD (sexually transmitted disease)       Screen STDs today   Relevant Orders   HIV Antibody (routine testing w rflx)   RPR    Urine cytology ancillary only   Immunization due       Flu vaccine given today   Relevant Orders   Flu vaccine trivalent PF, 6mos and older(Flulaval,Afluria,Fluarix,Fluzone) (Completed)       Return if symptoms worsen or fail to improve.    Gerre Scull, NP

## 2023-03-06 NOTE — Assessment & Plan Note (Signed)
She has been limiting eating out.  She would like her lipid panel rechecked today, order placed.

## 2023-03-06 NOTE — Assessment & Plan Note (Signed)
She has been experiencing seasonal allergies, especially with the change in weather.  Will have her start Malatesta milligrams daily.  She can also continue Flonase and cetirizine 10 mg daily as needed.  Discussed that these medications work best if she has been taking them daily for a few weeks.  She can start taking them about a month before her symptoms typically happen.  Follow-up with any concerns.

## 2023-03-06 NOTE — Assessment & Plan Note (Signed)
BMI 33.1.  She states that this is the heaviest she has been.  Discussed nutrition, exercise.  Will check A1c today.

## 2023-03-06 NOTE — Patient Instructions (Signed)
It was great to see you!  We are checking your labs today and will let you know the results via mychart/phone.   Start montelukast 1 tablet at bedtime for allergies. You can still take claritin or zyrtec or flonase as needed.   Make sure you shave in the direction of hair growth. Use lotion after shaving.   Let's follow-up with any concerns.   Take care,  Rodman Pickle, NP

## 2023-03-06 NOTE — Assessment & Plan Note (Signed)
Will check vitamin D levels and treat based on results.  

## 2023-03-06 NOTE — Addendum Note (Signed)
Addended by: Rodman Pickle A on: 03/06/2023 03:57 PM   Modules accepted: Orders

## 2023-03-07 LAB — HIV ANTIBODY (ROUTINE TESTING W REFLEX): HIV 1&2 Ab, 4th Generation: NONREACTIVE

## 2023-03-07 LAB — RPR: RPR Ser Ql: NONREACTIVE

## 2023-03-09 LAB — URINE CYTOLOGY ANCILLARY ONLY
Chlamydia: NEGATIVE
Comment: NEGATIVE
Comment: NORMAL
Neisseria Gonorrhea: NEGATIVE

## 2023-03-12 ENCOUNTER — Other Ambulatory Visit (HOSPITAL_COMMUNITY): Payer: Self-pay

## 2023-03-17 ENCOUNTER — Encounter: Payer: Self-pay | Admitting: Nurse Practitioner

## 2023-04-01 ENCOUNTER — Other Ambulatory Visit (HOSPITAL_COMMUNITY): Payer: Self-pay

## 2023-04-07 ENCOUNTER — Ambulatory Visit (HOSPITAL_COMMUNITY): Payer: Self-pay

## 2023-04-13 ENCOUNTER — Other Ambulatory Visit (HOSPITAL_COMMUNITY): Payer: Self-pay

## 2023-04-15 ENCOUNTER — Other Ambulatory Visit (HOSPITAL_COMMUNITY): Payer: Self-pay

## 2023-05-01 ENCOUNTER — Ambulatory Visit: Payer: Commercial Managed Care - PPO | Admitting: Nurse Practitioner

## 2023-05-01 ENCOUNTER — Telehealth: Payer: Self-pay | Admitting: Nurse Practitioner

## 2023-05-01 NOTE — Telephone Encounter (Signed)
11.22.24 no show

## 2023-05-05 NOTE — Telephone Encounter (Signed)
2nd no show, final warning sent via mail and mychart

## 2023-05-06 NOTE — Telephone Encounter (Signed)
Noted  

## 2023-05-11 ENCOUNTER — Ambulatory Visit: Payer: Commercial Managed Care - PPO | Admitting: Nurse Practitioner

## 2023-05-29 ENCOUNTER — Other Ambulatory Visit (HOSPITAL_COMMUNITY): Payer: Self-pay

## 2023-05-29 ENCOUNTER — Ambulatory Visit: Payer: Commercial Managed Care - PPO | Admitting: Nurse Practitioner

## 2023-05-29 VITALS — BP 116/82 | HR 88 | Temp 97.2°F | Ht 64.5 in | Wt 195.0 lb

## 2023-05-29 DIAGNOSIS — L729 Follicular cyst of the skin and subcutaneous tissue, unspecified: Secondary | ICD-10-CM | POA: Diagnosis not present

## 2023-05-29 MED ORDER — CEPHALEXIN 500 MG PO CAPS
500.0000 mg | ORAL_CAPSULE | Freq: Three times a day (TID) | ORAL | 0 refills | Status: AC
  Filled 2023-05-29: qty 21, 7d supply, fill #0

## 2023-05-29 NOTE — Progress Notes (Signed)
[    Acute Office Visit  Subjective:     Patient ID: Marie Wilcox, female    DOB: Jun 30, 2000, 22 y.o.   MRN: [id number]  Chief Complaint  Patient presents with   Lump in grion area    Right inner leg for 2 weeks    HPI Patient is in today for lump in right groin for 2 weeks.   Discussed the use of AI scribe software for clinical note transcription with the patient, who gave verbal consent to proceed.  History of Present Illness   The patient presents with a two-week history of a lump in the inner groin, first noticed after a waxing session. Initially, the lump was painful and larger, but the pain resolved within a day and the size has since decreased. The patient denies any associated fever or drainage from the lump. The lump is described as being deeper than a previous ingrown hair experienced by the patient, and not raised on the skin. The patient has no known allergies to antibiotics.     ROS See pertinent positives and negatives per HPI.     Objective:    BP 116/82 (BP Location: Left Arm, Patient Position: Sitting, Cuff Size: Normal)   Pulse 88   Temp (!) 97.2 F (36.2 C)   Ht 5' 4.5" (1.638 m)   Wt 195 lb (88.5 kg)   LMP 05/17/2023 (Exact Date)   SpO2 99%   BMI 32.95 kg/m    Physical Exam Vitals and nursing note reviewed.  Constitutional:      General: She is not in acute distress.    Appearance: Normal appearance.  HENT:     Head: Normocephalic.  Eyes:     Conjunctiva/sclera: Conjunctivae normal.  Pulmonary:     Effort: Pulmonary effort is normal.  Musculoskeletal:     Cervical back: Normal range of motion.  Skin:    General: Skin is warm.     Comments: Marble sized lump under skin on right inner thigh  Neurological:     General: No focal deficit present.     Mental Status: She is alert and oriented to person, place, and time.  Psychiatric:        Mood and Affect: Mood normal.        Behavior: Behavior normal.        Thought Content: Thought content  normal.        Judgment: Judgment normal.       Assessment & Plan:      Groin Cyst   The cyst, likely secondary to recent waxing, is palpable, non-tender, and has decreased in size with no signs of systemic infection. We will start Keflex, 500mg  tablet TID for 7 days. She should apply warm compresses to the area several times a day and contact the office if symptoms worsen or do not improve.       Meds ordered this encounter  Medications   cephALEXin (KEFLEX) 500 MG capsule    Sig: Take 1 capsule (500 mg total) by mouth 3 (three) times daily for 7 days.    Dispense:  21 capsule    Refill:  0    Return if symptoms worsen or fail to improve.  Gerre Scull, NP

## 2023-05-29 NOTE — Patient Instructions (Signed)
It was great to see you!  Start keflex 1 tablet 3 times a day for 7 days  Use warm compresses to the area several times a day.   Let's follow-up if your symptoms worsen or don't improve  Take care,  Rodman Pickle, NP

## 2023-07-14 ENCOUNTER — Ambulatory Visit
Admission: EM | Admit: 2023-07-14 | Discharge: 2023-07-14 | Disposition: A | Discharge status: Home / Self Care | Payer: Commercial Managed Care - PPO | Attending: Family Medicine | Admitting: Family Medicine

## 2023-07-14 DIAGNOSIS — J111 Influenza due to unidentified influenza virus with other respiratory manifestations: Secondary | ICD-10-CM

## 2023-07-14 MED ORDER — OSELTAMIVIR PHOSPHATE 75 MG PO CAPS
75.0000 mg | ORAL_CAPSULE | Freq: Two times a day (BID) | ORAL | 0 refills | Status: DC
  Filled 2023-07-14: qty 10, 5d supply, fill #0

## 2023-07-14 NOTE — Discharge Instructions (Addendum)
 Call health at work given you current symptoms are suspicious for influenza, [phone]  Treating you for an influenza-like illness. You are considered contagious to others as long as you have a measurable fever with a temperature 100 F.  You should consider yourself infectious until you are fever free for 24 hours without fever lowering medications. Continue to alternate Tylenol  and ibuprofen for management of fever.   Force fluids to maintain hydration. Tamiflu  twice daily for the next 5 days to reduce symptoms and course of influenza virus.   If you develop any shortness of breath, wheezing or difficulty breathing go immediately to the nearest emergency department.

## 2023-07-14 NOTE — ED Provider Notes (Signed)
 [facility]    CSN: [id number] Arrival date & time: 07/14/23  1903      History   Chief Complaint Chief Complaint  Patient presents with   Nasal Congestion   Headache   Fever    HPI Marie Wilcox is a 23 y.o. female.  Patient is here with one day history, cough, nasal congestion, fever, lower back pain and cough. No shortness of breath and no chest pains. 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 Patient denies hx of asthma or chronic respiratory disease. 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  OB History   No obstetric history on file.      Home Medications    Prior to Admission medications   Medication Sig Start Date End Date Taking? Authorizing Provider  oseltamivir  (TAMIFLU ) 75 MG capsule Take 1 capsule (75 mg total) by mouth 2 (two) times daily. 07/14/23  Yes Arloa Suzen RAMAN, NP  montelukast  (SINGULAIR ) 10 MG tablet Take 1 tablet (10 mg total) by mouth at bedtime. 03/06/23   McElwee, Lauren A, NP  valACYclovir  (VALTREX ) 1000 MG tablet Take 2 tablets (2,000 mg total) by mouth 2 (two) times daily as needed (cold sore). For 1 day as needed for outbreak. 03/06/23   McElwee, Lauren A, NP  Vitamin D , Ergocalciferol , (DRISDOL ) 1.25 MG (50000 UNIT) CAPS capsule Take 1 capsule (50,000 Units total) by mouth every 7 (seven) days. 03/06/23   McElwee, Tinnie LABOR, NP    Family History Family History  Problem Relation Age of Onset   Cancer Mother        multiple myeloma    Hypertension Mother    Cancer Father        uterocarcinoma   Stroke Maternal Grandfather    Cancer Maternal Grandfather        lung cancer   Cancer Other        breast    Social History Social History   Tobacco Use   Smoking status: Never  Vaping Use   Vaping status: Former  Substance Use Topics   Alcohol use: Yes    Comment: 1 glass wine every few weeks   Drug use: Not Currently    Types: Marijuana     Allergies   Patient has no known allergies.   Review of Systems Review of Systems  Constitutional:  Positive for fever.  Neurological:  Positive for headaches.     Physical Exam Triage Vital Signs ED Triage Vitals  Encounter Vitals Group     BP 07/14/23 1933 133/78     Systolic BP Percentile --      Diastolic BP Percentile --      Pulse Rate 07/14/23 1933 (!) 118     Resp 07/14/23 1933 20     Temp 07/14/23 1933 (!) 100.6 F (38.1 C)     Temp Source 07/14/23 1933 Oral     SpO2 07/14/23 1933 99 %     Weight --  Height --      Head Circumference --      Peak Flow --      Pain Score 07/14/23 1931 1     Pain Loc --      Pain Education --      Exclude from Growth Chart --    No data found.  Updated Vital Signs BP 133/78 (BP Location: Right Arm)   Pulse (!) 118   Temp (!) 100.6 F (38.1 C) (Oral)   Resp 20   LMP 06/21/2023 (Exact Date)   SpO2 99%   Visual Acuity Right Eye Distance:   Left Eye Distance:   Bilateral Distance:    Right Eye Near:   Left Eye Near:    Bilateral Near:     Physical Exam Vitals and nursing note reviewed.  Constitutional:      Appearance: She is well-developed. She is ill-appearing.  HENT:     Head: Normocephalic and atraumatic.     Nose: Congestion and rhinorrhea present. Rhinorrhea is purulent.     Mouth/Throat:     Mouth: Mucous membranes are moist.  Eyes:     Extraocular Movements: Extraocular movements intact.     Pupils: Pupils are equal, round, and reactive to light.  Cardiovascular:     Rate and  Rhythm: Regular rhythm. Tachycardia present.  Pulmonary:     Effort: Pulmonary effort is normal.     Breath sounds: Normal breath sounds.  Musculoskeletal:     Cervical back: Full passive range of motion without pain, normal range of motion and neck supple.  Lymphadenopathy:     Cervical: Cervical adenopathy present.  Skin:    General: Skin is warm and dry.  Neurological:     Mental Status: She is alert and oriented to person, place, and time.     UC Treatments / Results  Labs (all labs ordered are listed, but only abnormal results are displayed) Labs Reviewed - No data to display  EKG   Radiology No results found.  Procedures Procedures (including critical care time)  Medications Ordered in UC Medications - No data to display  Initial Impression / Assessment and Plan / UC Course  I have reviewed the triage vital signs and the nursing notes.  Pertinent labs & imaging results that were available during my care of the patient were reviewed by me and considered in my medical decision making (see chart for details).   Influenza Like Illness, based clinical findings and hx.Tamiflu  75 mg BID x 5 days. ER precautions if severe symptoms develop.  Final Clinical Impressions(s) / UC Diagnoses   Final diagnoses:  Influenza-like illness     Discharge Instructions      Call health at work given you current symptoms are suspicious for influenza, [phone]  Treating you for an influenza-like illness. You are considered contagious to others as long as you have a measurable fever with a temperature 100 F.  You should consider yourself infectious until you are fever free for 24 hours without fever lowering medications. Continue to alternate Tylenol  and ibuprofen for management of fever.   Force fluids to maintain hydration. Tamiflu  twice daily for the next 5 days to reduce symptoms and course of influenza virus.   If you develop any shortness of breath, wheezing or difficulty  breathing go immediately to the nearest emergency department.        ED Prescriptions     Medication Sig Dispense Auth. Provider   oseltamivir  (TAMIFLU ) 75 MG capsule Take 1  capsule (75 mg total) by mouth 2 (two) times daily. 10 capsule Arloa Suzen RAMAN, NP      PDMP not reviewed this encounter.   Arloa Suzen RAMAN, NP 07/16/23 1438

## 2023-07-14 NOTE — ED Triage Notes (Signed)
Pt presents with nasal congestion, cough, and fever. Symptoms onset this morning. Slight body aches in lower back. No SOB. No chest pains.

## 2023-07-15 ENCOUNTER — Other Ambulatory Visit (HOSPITAL_COMMUNITY): Payer: Self-pay

## 2023-10-02 ENCOUNTER — Other Ambulatory Visit (HOSPITAL_COMMUNITY): Payer: Self-pay

## 2023-10-02 ENCOUNTER — Ambulatory Visit: Admitting: Nurse Practitioner

## 2023-10-02 ENCOUNTER — Encounter: Payer: Self-pay | Admitting: Nurse Practitioner

## 2023-10-02 VITALS — BP 110/64 | HR 74 | Temp 97.0°F | Ht 64.5 in | Wt 181.6 lb

## 2023-10-02 DIAGNOSIS — L729 Follicular cyst of the skin and subcutaneous tissue, unspecified: Secondary | ICD-10-CM | POA: Insufficient documentation

## 2023-10-02 MED ORDER — CEPHALEXIN 500 MG PO CAPS
500.0000 mg | ORAL_CAPSULE | Freq: Two times a day (BID) | ORAL | 0 refills | Status: DC
  Filled 2023-10-02: qty 14, 7d supply, fill #0

## 2023-10-02 NOTE — Assessment & Plan Note (Signed)
 The chronic cyst on right inner thigh has increased in size and tenderness. Referral to general surgery is required for drainage. Prescribe Keflex  500 mg orally twice daily for 7 days. Advise warm compresses and instruct her to monitor for signs of infection. Discuss Keflex  side effects and advise reporting any symptoms.

## 2023-10-02 NOTE — Progress Notes (Signed)
 Acute Office Visit  Subjective:     Patient ID: Marie Wilcox, female    DOB: November 21, 2000, 23 y.o.   MRN: [id number]  Chief Complaint  Patient presents with   Cyst    cyst on right inner thigh    HPI Discussed the use of AI scribe software for clinical note transcription with the patient, who gave verbal consent to proceed.  History of Present Illness   The patient, with a history of cysts, presents with a growing cyst on her inner thigh. The cyst, initially small and asymptomatic, has grown larger and become tender over the past week. She also noticed a smaller cyst that has since burst and resolved on its own. She admits to poor adherence to a previously prescribed antibiotic regimen due to the cyst's lack of symptoms at the time. She has been applying warm compresses and maintaining good hygiene in the area. She also mentions a small, sore bump in the buttock crease, suspected to be another cyst.      ROS See pertinent positives and negatives per HPI.     Objective:    BP 110/64 (BP Location: Left Arm, Patient Position: Sitting, Cuff Size: Normal)   Pulse 74   Temp (!) 97 F (36.1 C)   Ht 5' 4.5" (1.638 m)   Wt 181 lb 9.6 oz (82.4 kg)   LMP 09/18/2023 (Exact Date)   SpO2 100%   BMI 30.69 kg/m  BP Readings from Last 3 Encounters:  10/02/23 110/64  07/14/23 133/78  05/29/23 116/82   Wt Readings from Last 3 Encounters:  10/02/23 181 lb 9.6 oz (82.4 kg)  05/29/23 195 lb (88.5 kg)  03/06/23 196 lb (88.9 kg)      Physical Exam Vitals and nursing note reviewed.  Constitutional:      General: She is not in acute distress.    Appearance: Normal appearance.  HENT:     Head: Normocephalic.  Eyes:     Conjunctiva/sclera: Conjunctivae normal.  Pulmonary:     Effort: Pulmonary effort is normal.  Musculoskeletal:     Cervical back: Normal range of motion.  Skin:    General: Skin is warm.     Comments: Tender, cyst under skin on right inner thigh approximately 1.5cm  diameter  Neurological:     General: No focal deficit present.     Mental Status: She is alert and oriented to person, place, and time.  Psychiatric:        Mood and Affect: Mood normal.        Behavior: Behavior normal.        Thought Content: Thought content normal.        Judgment: Judgment normal.       Assessment & Plan:   Problem List Items Addressed This Visit       Musculoskeletal and Integument   Cyst of skin - Primary   The chronic cyst on right inner thigh has increased in size and tenderness. Referral to general surgery is required for drainage. Prescribe Keflex  500 mg orally twice daily for 7 days. Advise warm compresses and instruct her to monitor for signs of infection. Discuss Keflex  side effects and advise reporting any symptoms.       Relevant Orders   Ambulatory referral to General Surgery    Meds ordered this encounter  Medications   cephALEXin  (KEFLEX ) 500 MG capsule    Sig: Take 1 capsule (500 mg total) by mouth 2 (two) times daily.  Dispense:  14 capsule    Refill:  0    Return if symptoms worsen or fail to improve.  Odette Benjamin, NP

## 2023-10-02 NOTE — Patient Instructions (Signed)
 It was great to see you!  Start keflex  twice a day for 7 days  Continue using warm compresses  I have placed a referral to surgery to get this drained  Let's follow-up if symptoms worsen or any concerns.   Take care,  Rheba Cedar, NP

## 2023-10-05 ENCOUNTER — Encounter: Payer: Self-pay | Admitting: Nurse Practitioner

## 2023-10-05 ENCOUNTER — Other Ambulatory Visit (HOSPITAL_COMMUNITY): Payer: Self-pay

## 2023-10-05 MED ORDER — FLUCONAZOLE 150 MG PO TABS
ORAL_TABLET | ORAL | 0 refills | Status: DC
  Filled 2023-10-05: qty 2, 3d supply, fill #0

## 2023-10-16 ENCOUNTER — Other Ambulatory Visit (HOSPITAL_COMMUNITY): Payer: Self-pay

## 2023-10-16 DIAGNOSIS — R2241 Localized swelling, mass and lump, right lower limb: Secondary | ICD-10-CM | POA: Diagnosis not present

## 2023-10-19 ENCOUNTER — Ambulatory Visit (INDEPENDENT_AMBULATORY_CARE_PROVIDER_SITE_OTHER): Payer: Commercial Managed Care - PPO | Admitting: Nurse Practitioner

## 2023-10-19 ENCOUNTER — Encounter: Payer: Self-pay | Admitting: Nurse Practitioner

## 2023-10-19 ENCOUNTER — Other Ambulatory Visit (HOSPITAL_COMMUNITY): Payer: Self-pay

## 2023-10-19 VITALS — BP 124/80 | HR 78 | Temp 97.1°F | Ht 64.5 in | Wt 181.4 lb

## 2023-10-19 DIAGNOSIS — B009 Herpesviral infection, unspecified: Secondary | ICD-10-CM | POA: Diagnosis not present

## 2023-10-19 DIAGNOSIS — E669 Obesity, unspecified: Secondary | ICD-10-CM

## 2023-10-19 DIAGNOSIS — E559 Vitamin D deficiency, unspecified: Secondary | ICD-10-CM

## 2023-10-19 DIAGNOSIS — Z809 Family history of malignant neoplasm, unspecified: Secondary | ICD-10-CM

## 2023-10-19 DIAGNOSIS — Z0001 Encounter for general adult medical examination with abnormal findings: Secondary | ICD-10-CM | POA: Diagnosis not present

## 2023-10-19 DIAGNOSIS — Z0000 Encounter for general adult medical examination without abnormal findings: Secondary | ICD-10-CM

## 2023-10-19 DIAGNOSIS — J351 Hypertrophy of tonsils: Secondary | ICD-10-CM | POA: Diagnosis not present

## 2023-10-19 DIAGNOSIS — E7800 Pure hypercholesterolemia, unspecified: Secondary | ICD-10-CM

## 2023-10-19 DIAGNOSIS — N898 Other specified noninflammatory disorders of vagina: Secondary | ICD-10-CM | POA: Diagnosis not present

## 2023-10-19 LAB — CBC WITH DIFFERENTIAL/PLATELET
Basophils Absolute: 0 10*3/uL (ref 0.0–0.1)
Basophils Relative: 0.7 % (ref 0.0–3.0)
Eosinophils Absolute: 0.2 10*3/uL (ref 0.0–0.7)
Eosinophils Relative: 5.4 % — ABNORMAL HIGH (ref 0.0–5.0)
HCT: 35.3 % — ABNORMAL LOW (ref 36.0–46.0)
Hemoglobin: 11.7 g/dL — ABNORMAL LOW (ref 12.0–15.0)
Lymphocytes Relative: 41.4 % (ref 12.0–46.0)
Lymphs Abs: 1.7 10*3/uL (ref 0.7–4.0)
MCHC: 33.1 g/dL (ref 30.0–36.0)
MCV: 85.7 fl (ref 78.0–100.0)
Monocytes Absolute: 0.3 10*3/uL (ref 0.1–1.0)
Monocytes Relative: 8 % (ref 3.0–12.0)
Neutro Abs: 1.9 10*3/uL (ref 1.4–7.7)
Neutrophils Relative %: 44.5 % (ref 43.0–77.0)
Platelets: 303 10*3/uL (ref 150.0–400.0)
RBC: 4.12 Mil/uL (ref 3.87–5.11)
RDW: 12.9 % (ref 11.5–15.5)
WBC: 4.2 10*3/uL (ref 4.0–10.5)

## 2023-10-19 LAB — COMPREHENSIVE METABOLIC PANEL WITH GFR
ALT: 12 U/L (ref 0–35)
AST: 19 U/L (ref 0–37)
Albumin: 4 g/dL (ref 3.5–5.2)
Alkaline Phosphatase: 51 U/L (ref 39–117)
BUN: 9 mg/dL (ref 6–23)
CO2: 25 meq/L (ref 19–32)
Calcium: 9.1 mg/dL (ref 8.4–10.5)
Chloride: 108 meq/L (ref 96–112)
Creatinine, Ser: 0.63 mg/dL (ref 0.40–1.20)
GFR: 125.26 mL/min (ref >60.00)
Glucose, Bld: 85 mg/dL (ref 70–99)
Potassium: 3.9 meq/L (ref 3.5–5.1)
Sodium: 139 meq/L (ref 135–145)
Total Bilirubin: 0.4 mg/dL (ref 0.2–1.2)
Total Protein: 7.5 g/dL (ref 6.0–8.3)

## 2023-10-19 LAB — LIPID PANEL
Cholesterol: 198 mg/dL (ref 0–200)
HDL: 55.3 mg/dL (ref >39.00)
LDL Cholesterol: 133 mg/dL — ABNORMAL HIGH (ref 0–99)
NonHDL: 142.57
Total CHOL/HDL Ratio: 4
Triglycerides: 46 mg/dL (ref 0.0–149.0)
VLDL: 9.2 mg/dL (ref 0.0–40.0)

## 2023-10-19 LAB — VITAMIN D 25 HYDROXY (VIT D DEFICIENCY, FRACTURES): VITD: 22.21 ng/mL — ABNORMAL LOW (ref 30.00–100.00)

## 2023-10-19 LAB — HEMOGLOBIN A1C: Hgb A1c MFr Bld: 5.1 % (ref 4.6–6.5)

## 2023-10-19 MED ORDER — VALACYCLOVIR HCL 1 G PO TABS
2000.0000 mg | ORAL_TABLET | Freq: Two times a day (BID) | ORAL | 1 refills | Status: DC | PRN
  Filled 2023-10-19: qty 12, 3d supply, fill #0
  Filled 2024-05-09: qty 12, 3d supply, fill #1

## 2023-10-19 MED ORDER — FLUCONAZOLE 150 MG PO TABS
ORAL_TABLET | ORAL | 0 refills | Status: DC
  Filled 2023-10-19: qty 2, 3d supply, fill #0

## 2023-10-19 NOTE — Assessment & Plan Note (Signed)
Chronic, stable.  Check CMP, CBC, lipid panel today. 

## 2023-10-19 NOTE — Assessment & Plan Note (Signed)
 BMI 30.6. She has continued focusing on exercise and nutrition and has maintained her weight loss so far. Continue with lifestyle changes. Check A1c today.

## 2023-10-19 NOTE — Progress Notes (Signed)
 BP 124/80 (BP Location: Left Arm, Patient Position: Sitting, Cuff Size: Normal)   Pulse 78   Temp (!) 97.1 F (36.2 C)   Ht 5' 4.5" (1.638 m)   Wt 181 lb 6.4 oz (82.3 kg)   LMP 10/14/2023 (Exact Date)   SpO2 100%   BMI 30.66 kg/m    Subjective:    Patient ID: Marie Wilcox, female    DOB: August 30, 2000, 23 y.o.   MRN: [id number]  CC: Chief Complaint  Patient presents with   Annual Exam    With fasting lab work, no ocncerns    HPI: Marie Wilcox is a 23 y.o. female presenting on 10/19/2023 for comprehensive medical examination. Current medical complaints include:none  She currently lives with: alone Menopausal Symptoms: no  Depression and Anxiety Screen done today and results listed below:     10/19/2023   10:19 AM 10/17/2022   10:46 AM 07/03/2021    8:14 AM  Depression screen PHQ 2/9  Decreased Interest 0 0 0  Down, Depressed, Hopeless 0 0 0  PHQ - 2 Score 0 0 0  Altered sleeping 0 0 0  Tired, decreased energy 0 0 0  Change in appetite 0 1 0  Feeling bad or failure about yourself  0 0 0  Trouble concentrating 0 0 0  Moving slowly or fidgety/restless 0 0 0  Suicidal thoughts 0 0 0  PHQ-9 Score 0 1 0  Difficult doing work/chores Not difficult at all Not difficult at all Not difficult at all      10/19/2023   10:19 AM 10/17/2022   10:47 AM 07/03/2021    8:15 AM  GAD 7 : Generalized Anxiety Score  Nervous, Anxious, on Edge 0 1 0  Control/stop worrying 0 0 0  Worry too much - different things 0 0 0  Trouble relaxing 0 0 0  Restless 0 0 0  Easily annoyed or irritable 0 0 0  Afraid - awful might happen 0 0 0  Total GAD 7 Score 0 1 0  Anxiety Difficulty Not difficult at all Not difficult at all Not difficult at all    The patient does not have a history of falls. I did not complete a risk assessment for falls. A plan of care for falls was not documented.   Past Medical History:  Past Medical History:  Diagnosis Date   Allergy    Seasonal/dog fur   Anxiety  08/02/12   When father died   Eczema    Environmental allergies    HSV-1 infection    Vitamin D  deficiency     Surgical History:  History reviewed. No pertinent surgical history.  Medications:  Current Outpatient Medications on File Prior to Visit  Medication Sig   montelukast  (SINGULAIR ) 10 MG tablet Take 1 tablet (10 mg total) by mouth at bedtime.   Vitamin D , Ergocalciferol , (DRISDOL ) 1.25 MG (50000 UNIT) CAPS capsule Take 1 capsule (50,000 Units total) by mouth every 7 (seven) days.   No current facility-administered medications on file prior to visit.    Allergies:  No Known Allergies  Social History:  Social History   Socioeconomic History   Marital status: Single    Spouse name: Not on file   Number of children: Not on file   Years of education: Not on file   Highest education level: Associate degree: academic program  Occupational History   Not on file  Tobacco Use   Smoking status: Never   Smokeless tobacco:  Not on file  Vaping Use   Vaping status: Former  Substance and Sexual Activity   Alcohol use: Not Currently    Alcohol/week: 1.0 standard drink of alcohol    Types: 1 Glasses of wine per week    Comment: 1 glass wine every few weeks   Drug use: Not Currently    Types: Marijuana   Sexual activity: Not Currently    Birth control/protection: Abstinence, None  Other Topics Concern   Not on file  Social History Narrative   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[facility]   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 sister   Social Drivers of Health   Financial Resource Strain: Low Risk  (05/29/2023)   Overall Financial Resource Strain (CARDIA)    Difficulty of Paying Living Expenses: Not hard at all  Food Insecurity: No Food Insecurity (05/29/2023)   Hunger Vital Sign    Worried About Running Out of Food in the Last Year: Never true    Ran Out of Food in the Last Year: Never true  Transportation Needs: No Transportation Needs (05/29/2023)   PRAPARE - Therapist, art (Medical): No    Lack of Transportation (Non-Medical): No  Physical Activity: Insufficiently Active (05/29/2023)   Exercise Vital Sign    Days of Exercise per Week: 5 days    Minutes of Exercise per Session: 20 min  Stress: No Stress Concern Present (05/29/2023)   [employer] of Occupational Health - Occupational Stress Questionnaire    Feeling of Stress : Not at all  Social Connections: Moderately Integrated (05/29/2023)   Social Connection and Isolation Panel [NHANES]    Frequency of Communication with Friends and Family: More than three times a week    Frequency of Social Gatherings with Friends and Family: More than three times a week    Attends Religious Services: More than 4 times per year    Active Member of [employer] or Organizations: Yes    Attends Engineer, structural: More than 4 times per year    Marital Status: Never married  Catering manager Violence: Not on file   Social History   Tobacco Use  Smoking Status Never  Smokeless Tobacco Not on file   Social History   Substance and Sexual Activity  Alcohol Use Not Currently   Alcohol/week: 1.0 standard drink of alcohol   Types: 1 Glasses of wine per week   Comment: 1 glass wine every few weeks    Family History:  Family History  Problem Relation Age of Onset   Cancer Mother        multiple myeloma   Hypertension Mother    Cancer Father        uterocarcinoma   Stroke Maternal Grandfather    Cancer Maternal Grandfather        lung cancer   Cancer Other        breast   Kidney disease Maternal Grandmother    ADD / ADHD Sister    ADD / ADHD Sister     Past medical history, surgical history, medications, allergies, family history and social history reviewed with patient today and changes made to appropriate areas of the chart.   Review of Systems  Constitutional:  Positive for malaise/fatigue. Negative for fever.  HENT: Negative.    Eyes: Negative.   Respiratory:  Negative.    Cardiovascular: Negative.   Gastrointestinal: Negative.   Genitourinary:  Negative for dysuria, frequency and urgency.  Vaginal itching  Musculoskeletal: Negative.   Skin: Negative.   Neurological: Negative.   Psychiatric/Behavioral: Negative.     All other ROS negative except what is listed above and in the HPI.      Objective:     BP 124/80 (BP Location: Left Arm, Patient Position: Sitting, Cuff Size: Normal)   Pulse 78   Temp (!) 97.1 F (36.2 C)   Ht 5' 4.5" (1.638 m)   Wt 181 lb 6.4 oz (82.3 kg)   LMP 10/14/2023 (Exact Date)   SpO2 100%   BMI 30.66 kg/m   Wt Readings from Last 3 Encounters:  10/19/23 181 lb 6.4 oz (82.3 kg)  10/02/23 181 lb 9.6 oz (82.4 kg)  05/29/23 195 lb (88.5 kg)    Physical Exam Vitals and nursing note reviewed.  Constitutional:      General: She is not in acute distress.    Appearance: Normal appearance.  HENT:     Head: Normocephalic and atraumatic.     Right Ear: Tympanic membrane, ear canal and external ear normal.     Left Ear: Tympanic membrane, ear canal and external ear normal.     Mouth/Throat:     Mouth: Mucous membranes are moist.     Pharynx: No posterior oropharyngeal erythema.     Tonsils: 3+ on the right. 3+ on the left.  Eyes:     Conjunctiva/sclera: Conjunctivae normal.  Cardiovascular:     Rate and Rhythm: Normal rate and regular rhythm.     Pulses: Normal pulses.     Heart sounds: Normal heart sounds.  Pulmonary:     Effort: Pulmonary effort is normal.     Breath sounds: Normal breath sounds.  Abdominal:     Palpations: Abdomen is soft.     Tenderness: There is no abdominal tenderness.  Musculoskeletal:        General: Normal range of motion.     Cervical back: Normal range of motion and neck supple.     Right lower leg: No edema.     Left lower leg: No edema.  Lymphadenopathy:     Cervical: No cervical adenopathy.  Skin:    General: Skin is warm and dry.  Neurological:     General: No  focal deficit present.     Mental Status: She is alert and oriented to person, place, and time.     Cranial Nerves: No cranial nerve deficit.     Coordination: Coordination normal.     Gait: Gait normal.  Psychiatric:        Mood and Affect: Mood normal.        Behavior: Behavior normal.        Thought Content: Thought content normal.        Judgment: Judgment normal.     Results for orders placed or performed in visit on 03/06/23  Urine cytology ancillary only   Collection Time: 03/06/23  8:56 AM  Result Value Ref Range   Neisseria Gonorrhea Negative    Chlamydia Negative    Comment Normal Reference Ranger Chlamydia - Negative    Comment      Normal Reference Range Neisseria Gonorrhea - Negative  Lipid panel   Collection Time: 03/06/23  9:12 AM  Result Value Ref Range   Cholesterol 197 0 - 200 mg/dL   Triglycerides 44.0 0.0 - 149.0 mg/dL   HDL 10.27 >25.36 mg/dL   VLDL 9.8 0.0 - 64.4 mg/dL   LDL Cholesterol 034 (H) 0 - 99 mg/dL  Total CHOL/HDL Ratio 3    NonHDL 134.66   VITAMIN D  25 Hydroxy (Vit-D Deficiency, Fractures)   Collection Time: 03/06/23  9:12 AM  Result Value Ref Range   VITD 19.88 (L) 30.00 - 100.00 ng/mL  Hemoglobin A1c   Collection Time: 03/06/23  9:12 AM  Result Value Ref Range   Hgb A1c MFr Bld 5.0 4.6 - 6.5 %  HIV Antibody (routine testing w rflx)   Collection Time: 03/06/23  9:12 AM  Result Value Ref Range   HIV 1&2 Ab, 4th Generation NON-REACTIVE NON-REACTIVE  RPR   Collection Time: 03/06/23  9:12 AM  Result Value Ref Range   RPR Ser Ql NON-REACTIVE NON-REACTIVE      Assessment & Plan:   Problem List Items Addressed This Visit       Other   Vitamin D  deficiency   Chronic, ongoing. She is taking OTC vitamin D  daily and a weekly prescription when she remembers. She has been having more fatigue recently. Check vitamin D  levels today and adjust regimen based on results.       Relevant Orders   VITAMIN D  25 Hydroxy (Vit-D Deficiency,  Fractures)   HSV-1 infection   History of cold sores, she has an expired prescription for valtrex  2,000mg  BID x1 day as needed. She has very rare outbreaks. Will send in a refill of valtrex  since last prescription out of date. Follow up with any concerns.       Relevant Medications   fluconazole  (DIFLUCAN ) 150 MG tablet   valACYclovir  (VALTREX ) 1000 MG tablet   Routine general medical examination at a health care facility - Primary   Health maintenance reviewed and updated. Discussed nutrition, exercise. Follow-up 1 year.        Pure hypercholesterolemia   Chronic, stable. Check CMP, CBC, lipid panel today.       Relevant Orders   CBC with Differential/Platelet   Comprehensive metabolic panel with GFR   Lipid panel   Obesity (BMI 30-39.9)   BMI 30.6. She has continued focusing on exercise and nutrition and has maintained her weight loss so far. Continue with lifestyle changes. Check A1c today.       Relevant Orders   Hemoglobin A1c   Other Visit Diagnoses       Vaginal itching       Due to recent antibiotics. Start diflucan  1 tablet today and a second tablet in 3 days if needed.     Enlarged tonsils       Tonsils 3+. She will sometimes wake up gasping for breath. Referral placed to ENT for further evaluation.   Relevant Orders   Ambulatory referral to ENT     Family history of cancer       Mom has history of multiple myeloma and dad uretrocarcinoma. Cousin had breast cancer and maternal grandfather lung cancer. Referral placed to genetics.   Relevant Orders   Ambulatory referral to Genetics        Follow up plan: Return in about 1 year (around 10/18/2024) for CPE.   LABORATORY TESTING:  - Pap smear: declined - on menses  IMMUNIZATIONS:   - Tdap: Tetanus vaccination status reviewed: last tetanus booster within 10 years. - Influenza: Postponed to flu season - Pneumovax: Not applicable - Prevnar: Not applicable - HPV: Up to date - Shingrix vaccine: Not  applicable  SCREENING: -Mammogram: Not applicable  - Colonoscopy: Not applicable  - Bone Density: Not applicable   PATIENT COUNSELING:   Advised to take  1 mg of folate supplement per day if capable of pregnancy.   Sexuality: Discussed sexually transmitted diseases, partner selection, use of condoms, avoidance of unintended pregnancy  and contraceptive alternatives.   Advised to avoid cigarette smoking.  I discussed with the patient that most people either abstain from alcohol or drink within safe limits (<=14/week and <=4 drinks/occasion for males, <=7/weeks and <= 3 drinks/occasion for females) and that the risk for alcohol disorders and other health effects rises proportionally with the number of drinks per week and how often a drinker exceeds daily limits.  Discussed cessation/primary prevention of drug use and availability of treatment for abuse.   Diet: Encouraged to adjust caloric intake to maintain  or achieve ideal body weight, to reduce intake of dietary saturated fat and total fat, to limit sodium intake by avoiding high sodium foods and not adding table salt, and to maintain adequate dietary potassium and calcium preferably from fresh fruits, vegetables, and low-fat dairy products.    stressed the importance of regular exercise  Injury prevention: Discussed safety belts, safety helmets, smoke detector, smoking near bedding or upholstery.   Dental health: Discussed importance of regular tooth brushing, flossing, and dental visits.    NEXT PREVENTATIVE PHYSICAL DUE IN 1 YEAR. Return in about 1 year (around 10/18/2024) for CPE.  Marie Wilcox

## 2023-10-19 NOTE — Patient Instructions (Signed)
 It was great to see you!  We are checking your labs today and will let you know the results via mychart/phone.   I have placed a referral to the genetics counselor to discuss screening options  I have refilled your dilucan and valtrex   Let's follow-up in 1 year, sooner if you have concerns.  If a referral was placed today, you will be contacted for an appointment. Please note that routine referrals can sometimes take up to 3-4 weeks to process. Please call our office if you haven't heard anything after this time frame.  Take care,  Rheba Cedar, NP

## 2023-10-19 NOTE — Assessment & Plan Note (Signed)
Health maintenance reviewed and updated. Discussed nutrition, exercise. Follow-up 1 year.

## 2023-10-19 NOTE — Assessment & Plan Note (Signed)
History of cold sores, she has an expired prescription for valtrex 2,000mg  BID x1 day as needed. She has very rare outbreaks. Will send in a refill of valtrex since last prescription out of date. Follow up with any concerns.

## 2023-10-19 NOTE — Assessment & Plan Note (Signed)
 Chronic, ongoing. She is taking OTC vitamin D  daily and a weekly prescription when she remembers. She has been having more fatigue recently. Check vitamin D  levels today and adjust regimen based on results.

## 2023-10-20 ENCOUNTER — Encounter: Payer: Self-pay | Admitting: Nurse Practitioner

## 2023-10-20 ENCOUNTER — Other Ambulatory Visit: Payer: Self-pay

## 2023-10-20 DIAGNOSIS — D649 Anemia, unspecified: Secondary | ICD-10-CM

## 2023-10-23 ENCOUNTER — Other Ambulatory Visit: Payer: Self-pay | Admitting: General Surgery

## 2023-10-23 DIAGNOSIS — R2241 Localized swelling, mass and lump, right lower limb: Secondary | ICD-10-CM

## 2023-10-30 ENCOUNTER — Ambulatory Visit
Admission: RE | Admit: 2023-10-30 | Discharge: 2023-10-30 | Disposition: A | Discharge status: Home / Self Care | Source: Ambulatory Visit | Attending: General Surgery | Admitting: General Surgery

## 2023-10-30 DIAGNOSIS — R2241 Localized swelling, mass and lump, right lower limb: Secondary | ICD-10-CM | POA: Diagnosis not present

## 2023-11-06 ENCOUNTER — Ambulatory Visit: Admitting: Nurse Practitioner

## 2023-11-10 ENCOUNTER — Other Ambulatory Visit: Payer: Self-pay | Admitting: General Surgery

## 2023-11-10 DIAGNOSIS — R2241 Localized swelling, mass and lump, right lower limb: Secondary | ICD-10-CM

## 2023-11-13 ENCOUNTER — Ambulatory Visit
Admission: RE | Admit: 2023-11-13 | Discharge: 2023-11-13 | Disposition: A | Discharge status: Home / Self Care | Source: Ambulatory Visit | Attending: General Surgery | Admitting: General Surgery

## 2023-11-13 DIAGNOSIS — R2241 Localized swelling, mass and lump, right lower limb: Secondary | ICD-10-CM | POA: Diagnosis not present

## 2023-11-13 MED ORDER — GADOPICLENOL 0.5 MMOL/ML IV SOLN
8.0000 mL | Freq: Once | INTRAVENOUS | Status: AC | PRN
  Administered 2023-11-13: 8 mL via INTRAVENOUS

## 2023-11-20 ENCOUNTER — Ambulatory Visit: Payer: Self-pay | Admitting: General Surgery

## 2023-11-20 DIAGNOSIS — R2241 Localized swelling, mass and lump, right lower limb: Secondary | ICD-10-CM | POA: Diagnosis not present

## 2023-12-22 ENCOUNTER — Institutional Professional Consult (permissible substitution) (INDEPENDENT_AMBULATORY_CARE_PROVIDER_SITE_OTHER): Admitting: Otolaryngology

## 2024-01-08 ENCOUNTER — Encounter: Payer: Self-pay | Admitting: Genetic Counselor

## 2024-01-08 ENCOUNTER — Inpatient Hospital Stay

## 2024-01-08 ENCOUNTER — Inpatient Hospital Stay: Attending: Genetic Counselor | Admitting: Genetic Counselor

## 2024-01-08 DIAGNOSIS — Z8059 Family history of malignant neoplasm of other urinary tract organ: Secondary | ICD-10-CM

## 2024-01-08 DIAGNOSIS — Z807 Family history of other malignant neoplasms of lymphoid, hematopoietic and related tissues: Secondary | ICD-10-CM

## 2024-01-08 NOTE — Progress Notes (Signed)
 REFERRING PROVIDER: Nedra Tinnie LABOR, NP [address] [place],  [place] [address]  PRIMARY PROVIDER:  Nedra Tinnie LABOR, NP  PRIMARY REASON FOR VISIT:  1. Family history of multiple myeloma   2. Family history of malignant neoplasm of ureter      HISTORY OF PRESENT ILLNESS:   Marie Wilcox, a 23 y.o. female, was seen for a [place] cancer genetics consultation at the request of McElwee, Lauren A, NP due to a family history of cancer.  Marie Wilcox presents to clinic today to discuss the possibility of a hereditary predisposition to cancer, to discuss genetic testing, and to further clarify her future cancer risks, as well as potential cancer risks for family members.   Marie Wilcox is a 23 y.o. female with no personal history of cancer.    RELEVANT MEDICAL HISTORY:  Menarche was at age 74.  Nulliparous.  OCP use for approximately 0 years.  Ovaries intact: yes.  Uterus intact: yes.  Menopausal status: premenopausal.  HRT use: 0 years. Colonoscopy: no; not examined. Mammogram within the last year: no. Number of breast biopsies: 0. Up to date with pelvic exams: completed last year   Past Medical History:  Diagnosis Date   Allergy    Seasonal/dog fur   Anxiety August 04, 2012   When father died   Eczema    Environmental allergies    HSV-1 infection    Vitamin D  deficiency     No past surgical history on file.  Social History   Socioeconomic History   Marital status: Single    Spouse name: Not on file   Number of children: Not on file   Years of education: Not on file   Highest education level: Associate degree: academic program  Occupational History   Not on file  Tobacco Use   Smoking status: Never   Smokeless tobacco: Not on file  Vaping Use   Vaping status: Former  Substance and Sexual Activity   Alcohol use: Not Currently    Alcohol/week: 1.0 standard drink of alcohol    Types: 1 Glasses of wine per week    Comment: 1 glass wine every few weeks   Drug use: Not  Currently    Types: Marijuana   Sexual activity: Not Currently    Birth control/protection: Abstinence, None  Other Topics Concern   Not on file  Social History Narrative   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[facility]   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 sister   Social Drivers of Health   Financial Resource Strain: Low Risk  (05/29/2023)   Overall Financial Resource Strain (CARDIA)    Difficulty of Paying Living Expenses: Not hard at all  Food Insecurity: No Food Insecurity (05/29/2023)   Hunger Vital Sign    Worried About Running Out of Food in the Last Year: Never true    Ran Out of Food in the Last Year: Never true  Transportation Needs: No Transportation Needs (05/29/2023)   PRAPARE - Administrator, Civil Service (Medical): No    Lack of Transportation (Non-Medical): No  Physical Activity: Insufficiently Active (05/29/2023)   Exercise Vital Sign    Days of Exercise per Week: 5 days    Minutes of Exercise per Session: 20 min  Stress: No Stress Concern Present (05/29/2023)   [employer] of Occupational Health - Occupational Stress Questionnaire    Feeling of Stress : Not at all  Social Connections: Moderately Integrated (05/29/2023)   Social Connection and Isolation Panel  Frequency of Communication with Friends and Family: More than three times a week    Frequency of Social Gatherings with Friends and Family: More than three times a week    Attends Religious Services: More than 4 times per year    Active Member of [employer] or Organizations: Yes    Attends Engineer, structural: More than 4 times per year    Marital Status: Never married     FAMILY HISTORY:  We obtained a detailed, 4-generation family history.  Significant diagnoses are listed below: Family History  Problem Relation Age of Onset   Hypertension Mother    Multiple myeloma Mother 67   Cancer Father 85 - 92       ureter carcinoma   ADD / ADHD Sister    ADD / ADHD Half-Sister    Kidney  disease Maternal Grandmother    Cancer Maternal Grandfather        stomach, brain   Stroke Maternal Grandfather    Cancer Paternal Grandmother    Breast cancer Other        maternal great aunt   Breast cancer Other        maternal great aunt    Marie Wilcox is unaware of previous family history of genetic testing for hereditary cancer risks. There is no reported Ashkenazi Jewish ancestry.    GENETIC COUNSELING ASSESSMENT: Marie Wilcox is a 23 y.o. female with a family history of cancer. We, therefore, discussed and recommended the following at today's visit.   DISCUSSION: We discussed that 5 - 10% of cancer is hereditary, while most cancers are thought to be environmental or sporadic.  We discussed that testing is beneficial for several reasons, including knowing about other cancer risks, identifying potential screening and risk-reduction options that may be appropriate, and to understanding if other family members could be at risk for cancer and allowing them to undergo genetic testing.  We reviewed the characteristics, features and inheritance patterns of hereditary cancer syndromes. We also discussed genetic testing, including the appropriate family members to test, the process of testing, insurance coverage and turn-around-time for results. We discussed the implications of a negative, positive, carrier and/or variant of uncertain significant result. We discussed that negative results would be uninformative given that Marie Wilcox does not have a personal history of cancer.   Marie Wilcox was offered a common hereditary cancer panel (40 genes) and an expanded pan-cancer panel (77 genes). Marie Wilcox was informed of the benefits and limitations of each panel, including that expanded pan-cancer panels contain several genes that do not have clear management guidelines at this point in time.  We also discussed that as the number of genes included on a panel increases, the chances of variants of uncertain  significance increases.   We discussed with Marie Wilcox that the family history does not meet insurance or NCCN criteria for genetic testing and, therefore, is not highly consistent with a familial hereditary cancer syndrome.  We feel she is at low risk to harbor  a gene mutation associated with such a condition. Did discuss option to pursue genetic testing with [facility] self-pay option ($250.00) or with research, such as the GeneConnect study. We recommend Marie Wilcox continue to follow the cancer screening guidelines given by her primary healthcare provider.  We discussed that some people do not want to undergo genetic testing due to fear of genetic discrimination.  A federal law called the Genetic Information Non-Discrimination Act (GINA) of 2008 helps protect individuals against genetic discrimination  based on their genetic test results.  It impacts both health insurance and employment.  With health insurance, it protects against increased premiums, being kicked off insurance or being forced to take a test in order to be insured.  For employment it protects against hiring, firing and promoting decisions based on genetic test results.  GINA does not apply to those in the [employer], those who work for companies with less than 15 employees, and new life insurance or long-term disability insurance policies.  Health status due to a cancer diagnosis is not protected under GINA.  PLAN: After considering the risks, benefits, and limitations, Marie Wilcox did not wish to pursue genetic testing at today's visit. We understand this decision and remain available to coordinate genetic testing at any time in the future. We, therefore, recommend Marie Wilcox continue to follow the cancer screening guidelines given by her primary healthcare provider.  Lastly, we encouraged Marie Wilcox to remain in contact with cancer genetics annually so that we can continuously update the family history and inform her of any changes in cancer genetics and  testing that may be of benefit for this family.   Marie Wilcox were answered to her satisfaction today. Our contact information was provided should additional Wilcox or concerns arise. Thank you for the referral and allowing us  to share in the care of your patient.   Burnard Ogren, MS, [facility] Licensed, Retail banker.[email or web address] phone: [phone]   40 minutes were spent on the date of the encounter in service to the patient including preparation, face-to-face consultation, documentation and care coordination.  The patient was seen alone.  Drs. Gudena and/or Lanny were available to discuss this case as needed.  _______________________________________________________________________ For Office Staff:  Number of people involved in session: 1 Was an Intern/ student involved with case: no

## 2024-01-14 ENCOUNTER — Ambulatory Visit: Admitting: Nurse Practitioner

## 2024-01-14 ENCOUNTER — Telehealth: Payer: Self-pay | Admitting: Nurse Practitioner

## 2024-01-14 NOTE — Telephone Encounter (Signed)
 05/01/2023 no show 01/14/2024 same day cancel via mychart/unable to get off of work  Final warning sent via mail and mychart

## 2024-01-15 NOTE — Telephone Encounter (Signed)
 Noted

## 2024-02-05 DIAGNOSIS — W548XXA Other contact with dog, initial encounter: Secondary | ICD-10-CM | POA: Diagnosis not present

## 2024-02-05 DIAGNOSIS — S90411A Abrasion, right great toe, initial encounter: Secondary | ICD-10-CM | POA: Diagnosis not present

## 2024-02-12 ENCOUNTER — Ambulatory Visit: Admitting: Nurse Practitioner

## 2024-02-12 ENCOUNTER — Encounter: Payer: Self-pay | Admitting: Nurse Practitioner

## 2024-02-12 ENCOUNTER — Telehealth: Payer: Self-pay | Admitting: Nurse Practitioner

## 2024-02-12 NOTE — Telephone Encounter (Signed)
 05/01/2023 no show 01/14/2024 same day cancel via mychart/work 02/12/2024 no show  Pt is scheduled for CPE 10/21/24 - do you want to proceed with dismissal?

## 2024-02-16 ENCOUNTER — Other Ambulatory Visit (HOSPITAL_COMMUNITY): Payer: Self-pay

## 2024-02-23 ENCOUNTER — Institutional Professional Consult (permissible substitution) (INDEPENDENT_AMBULATORY_CARE_PROVIDER_SITE_OTHER): Admitting: Otolaryngology

## 2024-02-26 ENCOUNTER — Other Ambulatory Visit (HOSPITAL_COMMUNITY): Payer: Self-pay

## 2024-05-09 ENCOUNTER — Telehealth: Payer: Self-pay

## 2024-05-09 NOTE — Telephone Encounter (Signed)
 Copied from CRM #[id number]. Topic: Appointments - Transfer of Care >> May 09, 2024  2:42 PM Lauren C wrote: Pt is requesting to transfer FROM: McElwee Pt is requesting to transfer TO: Saddie Sacks Reason for requested transfer: Pt was discharged from practice with [facility] due to excessive no-shows, looking for new provider. It is the responsibility of the team the patient would like to transfer to [facility]) to reach out to the patient if for any reason this transfer is not acceptable.

## 2024-06-17 ENCOUNTER — Other Ambulatory Visit: Payer: Self-pay | Admitting: Medical Genetics

## 2024-06-23 ENCOUNTER — Other Ambulatory Visit (HOSPITAL_COMMUNITY): Payer: Self-pay

## 2024-06-23 ENCOUNTER — Ambulatory Visit (INDEPENDENT_AMBULATORY_CARE_PROVIDER_SITE_OTHER)

## 2024-06-23 VITALS — BP 110/69 | HR 96 | Temp 98.0°F | Ht 64.5 in | Wt 175.0 lb

## 2024-06-23 DIAGNOSIS — Z7689 Persons encountering health services in other specified circumstances: Secondary | ICD-10-CM | POA: Diagnosis not present

## 2024-06-23 DIAGNOSIS — E7800 Pure hypercholesterolemia, unspecified: Secondary | ICD-10-CM | POA: Diagnosis not present

## 2024-06-23 DIAGNOSIS — B009 Herpesviral infection, unspecified: Secondary | ICD-10-CM

## 2024-06-23 DIAGNOSIS — L700 Acne vulgaris: Secondary | ICD-10-CM | POA: Insufficient documentation

## 2024-06-23 DIAGNOSIS — E559 Vitamin D deficiency, unspecified: Secondary | ICD-10-CM | POA: Diagnosis not present

## 2024-06-23 DIAGNOSIS — L309 Dermatitis, unspecified: Secondary | ICD-10-CM | POA: Diagnosis not present

## 2024-06-23 DIAGNOSIS — E669 Obesity, unspecified: Secondary | ICD-10-CM

## 2024-06-23 DIAGNOSIS — L209 Atopic dermatitis, unspecified: Secondary | ICD-10-CM

## 2024-06-23 MED ORDER — CLINDAMYCIN PHOS-BENZOYL PEROX 1-5 % EX GEL
Freq: Every morning | CUTANEOUS | 0 refills | Status: AC
  Filled 2024-06-23: qty 25, 30d supply, fill #0

## 2024-06-23 MED ORDER — VALACYCLOVIR HCL 1 G PO TABS
2000.0000 mg | ORAL_TABLET | Freq: Two times a day (BID) | ORAL | 1 refills | Status: AC | PRN
  Filled 2024-06-23: qty 12, 3d supply, fill #0

## 2024-06-23 MED ORDER — EUCRISA 2 % EX OINT
TOPICAL_OINTMENT | CUTANEOUS | 2 refills | Status: AC
  Filled 2024-06-23: qty 60, 90d supply, fill #0
  Filled 2024-06-29: qty 60, 30d supply, fill #0
  Filled 2024-07-13: qty 60, 90d supply, fill #0
  Filled 2024-07-13: qty 60, 30d supply, fill #0

## 2024-06-23 MED ORDER — TRETINOIN 0.025 % EX CREA
TOPICAL_CREAM | Freq: Every day | CUTANEOUS | 0 refills | Status: AC
  Filled 2024-06-23: qty 45, 90d supply, fill #0

## 2024-06-23 NOTE — Patient Instructions (Signed)
 VISIT SUMMARY: Today, you had a routine check-up and discussed several health concerns including acne, eczema, anxiety, herpetic flare-ups, and tonsil issues. We also reviewed your general health maintenance and updated your prescriptions.  YOUR PLAN: ACNE VULGARIS: You have mild acne with whiteheads. -Use benzoyl peroxide  cream in the morning. -Use tretinoin  A cream at night. -Use a non-comedogenic SPF moisturizer. -Continue using your current face wash if it is effective. -Follow up in 12 weeks to assess the treatment's effectiveness.  ATOPIC DERMATITIS: You have eczema flare-ups on your arms, shoulders, chest, face, and neck. -Use Eucrisa  as prescribed. We have obtained prior authorization from your insurance. -We documented your previous use of triamcinolone for insurance purposes. -Follow up in 12 weeks to assess the treatment's effectiveness.  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 -Continue using Valtrex  as needed for flare-ups.  HYPERTROPHY OF TONSILS WITH TONSILLOLITHIASIS: You have enlarged tonsils with tonsil stones, but they do not significantly interfere with your breathing or sleep. -Gargle daily with salt water or mouthwash to prevent tonsil stones. -Monitor your tonsil size and symptoms. We will consider an ENT referral if your symptoms worsen.  VITAMIN D  DEFICIENCY: Your vitamin D  levels have not been checked in almost a year. -We ordered a vitamin D  level as part of your fasting lab work.  GENERAL HEALTH MAINTENANCE: We reviewed your vaccinations and general health maintenance. -You received a flu shot in October. Your tetanus, HPV, and Hepatitis B vaccinations are up to date. -Schedule a Pap smear between now and your next physical. -Complete the fasting labs including cholesterol panel, kidney and liver function, A1c, vitamin D , and blood counts.  If you have any problems before your next visit feel free to message me via MyChart (minor issues  or questions) or call the office, otherwise you may reach out to schedule an office visit.  Thank you! Saddie Sacks, PA-C

## 2024-06-23 NOTE — Assessment & Plan Note (Signed)
 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 She has very rare outbreaks. Will send in a refill of valtrex 

## 2024-06-23 NOTE — Assessment & Plan Note (Signed)
 Mild facial acne with whiteheads, improved with current face wash. - Prescribed benzoyl peroxide - clindamycin  gel for morning use. - Prescribed tretinoin  A 0.025% cream for nighttime use. - Advised use of non-comedogenic SPF moisturizer. Recommended brands such as The Ordinary or Byoma.  - Instructed to continue current face wash if effective. - Scheduled follow-up in 12 weeks to assess treatment efficacy.

## 2024-06-23 NOTE — Assessment & Plan Note (Signed)
 Checking CMP and lipid panel with labs.

## 2024-06-23 NOTE — Assessment & Plan Note (Signed)
 Chronic. Has tried and failed triamcinolone cream and OTC hydrocortisone cream. Experienced withdrawal skin side effects after stopping triamcinolone. Little improvement in appearance or symptoms. Discussed nonsteroidal treatment options such as Eucrisa  or tacrolimus. Insurance will cover Eucrisa  with prior auth and proof of step wise treatment. Prescribed Eucrisa  BID for better eczema control. Follow up in 3 months to reassess.

## 2024-06-23 NOTE — Progress Notes (Signed)
 "  New Patient Office Visit  Subjective    Patient ID: Marie Wilcox, female    DOB: 2000-06-16  Age: 24 y.o. MRN: [id number]  CC:  Chief Complaint  Patient presents with   New Patient (Initial Visit)    Establish Care    History of Present Illness   Marie Wilcox is a 24 year old female who presents as a new patient to establish primary care for a routine medical check-up and medication refills.  Herpetic flare-ups HSV1  - Uses Valtrex  as needed for flare-ups. - Symptoms stable   Anxiety and sleep disturbance - Anxiety interferes with sleep, characterized by difficulty 'turning her brain off' and episodes of panic while in bed. - No prior treatments for anxiety. - Uncertain about pursuing medication or therapy. - Has not tried therapy   Acne and eczema - Troubled by acne, primarily whiteheads. - Has tried CeraVe and other OTC products without improvement. - Eczema flares around chin and forehead accompanying acne.  Eczema flares - Eczema flares on arms, shoulders, chest, and the bottom half of face and neck. - Previously used triamcinolone for eczema but found medication ineffective. Also experienced withdrawal side effects when stopping this medication after using it daily.  - Interested in non-steroid treatment options.  Tonsillar enlargement and tonsil stones - History of enlarged tonsils and tonsil stones. - Previously evaluated by dentist and provider, referred to ENT but reports she got nervous so she didn't go to ENT appt - Experiences tonsil stones without significant interference with breathing or sleep.       Screenings:  Colon Cancer: NA Lung Cancer: NA Cervical Cancer: Due; will plan to complete this year  Breast Cancer: NA Diabetes: Checking A1c with labs HLD: Checking lipid panel with labs The ASCVD Risk score (Arnett DK, et al., 2019) failed to calculate for the following reasons:   The 2019 ASCVD risk score is only valid for ages 50 to 23   * -  Cholesterol units were assumed   Outpatient Encounter Medications as of 06/23/2024  Medication Sig   BIOTIN PO Take by mouth daily.   clindamycin -benzoyl peroxide  (BENZACLIN) gel Apply topically in the morning.   Coenzyme Q10 (COQ10 PO) Take by mouth daily.   CRANBERRY PO Take by mouth daily.   Crisaborole  (EUCRISA ) 2 % OINT Apply a thin layer to the affected areas of eczema twice daily   Multiple Vitamin (MULTIVITAMIN PO) Take by mouth daily.   Probiotic Product (PROBIOTIC PO) Take by mouth daily.   tretinoin  (RETIN-A ) 0.025 % cream Apply topically at bedtime.   VITAMIN D  PO Take by mouth daily.   [DISCONTINUED] valACYclovir  (VALTREX ) 1000 MG tablet Take 2 tablets (2,000 mg total) by mouth 2 (two) times daily for 1 day as needed for cold sore outbreak.   [DISCONTINUED] Vitamin D , Ergocalciferol , (DRISDOL ) 1.25 MG (50000 UNIT) CAPS capsule Take 1 capsule (50,000 Units total) by mouth every 7 (seven) days.   fluconazole  (DIFLUCAN ) 150 MG tablet Take 1 tablet today and 1 tablet in 3 days.   valACYclovir  (VALTREX ) 1000 MG tablet Take 2 tablets (2,000 mg total) by mouth 2 (two) times daily for 1 day as needed for cold sore outbreak.   [DISCONTINUED] montelukast  (SINGULAIR ) 10 MG tablet Take 1 tablet (10 mg total) by mouth at bedtime.   No facility-administered encounter medications on file as of 06/23/2024.    Past Medical History:  Diagnosis Date   Allergy    Seasonal/dog fur   Anxiety  07/07/2012   When father died   Eczema    Environmental allergies    HSV-1 infection    Vitamin D  deficiency     History reviewed. No pertinent surgical history.  Family History  Problem Relation Age of Onset   Hypertension Mother    Multiple myeloma Mother 51   Cancer Father 5 - 73       ureter carcinoma   ADD / ADHD Sister    ADD / ADHD Half-Sister    Kidney disease Maternal Grandmother    Cancer Maternal Grandfather        stomach, brain   Stroke Maternal Grandfather    Cancer Paternal  Grandmother    Breast cancer Other        maternal great aunt   Breast cancer Other        maternal great aunt    Social History   Socioeconomic History   Marital status: Single    Spouse name: Not on file   Number of children: Not on file   Years of education: Not on file   Highest education level: Associate degree: academic program  Occupational History   Not on file  Tobacco Use   Smoking status: Never   Smokeless tobacco: Not on file  Vaping Use   Vaping status: Former  Substance and Sexual Activity   Alcohol use: Not Currently    Alcohol/week: 1.0 standard drink of alcohol    Types: 1 Glasses of wine per week    Comment: 1 glass wine every few weeks   Drug use: Not Currently    Types: Marijuana   Sexual activity: Not Currently    Birth control/protection: Abstinence, None  Other Topics Concern   Not on file  Social History Narrative   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 [employer]   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 sister   Social Drivers of Health   Tobacco Use: Unknown (06/23/2024)   Patient History    Smoking Tobacco Use: Never    Smokeless Tobacco Use: Unknown    Passive Exposure: Not on file  Financial Resource Strain: Low Risk (06/19/2024)   Overall Financial Resource Strain (CARDIA)    Difficulty of Paying Living Expenses: Not hard at all  Food Insecurity: No Food Insecurity (06/19/2024)   Epic    Worried About Programme Researcher, Broadcasting/film/video in the Last Year: Never true    Ran Out of Food in the Last Year: Never true  Transportation Needs: No Transportation Needs (06/19/2024)   Epic    Lack of Transportation (Medical): No    Lack of Transportation (Non-Medical): No  Physical Activity: Insufficiently Active (06/19/2024)   Exercise Vital Sign    Days of Exercise per Week: 7 days    Minutes of Exercise per Session: 10 min  Stress: Stress Concern Present (06/19/2024)   [employer] of Occupational Health - Occupational Stress Questionnaire    Feeling of Stress: To some  extent  Social Connections: Moderately Integrated (06/19/2024)   Social Connection and Isolation Panel    Frequency of Communication with Friends and Family: More than three times a week    Frequency of Social Gatherings with Friends and Family: More than three times a week    Attends Religious Services: More than 4 times per year    Active Member of [employer] or Organizations: Yes    Attends Engineer, Structural: More than 4 times per year    Marital Status: Never married  Intimate Partner Violence: Not  on file  Depression (PHQ2-9): Low Risk (06/23/2024)   Depression (PHQ2-9)    PHQ-2 Score: 3  Alcohol Screen: Low Risk (05/29/2023)   Alcohol Screen    Last Alcohol Screening Score (AUDIT): 1  Housing: Low Risk (06/19/2024)   Epic    Unable to Pay for Housing in the Last Year: No    Number of Times Moved in the Last Year: 0    Homeless in the Last Year: No  Utilities: Not on file  Health Literacy: Not on file    ROS  Per HPI      Objective    BP 110/69   Pulse 96   Temp 98 F (36.7 C) (Oral)   Ht 5' 4.5 (1.638 m)   Wt 175 lb 0.6 oz (79.4 kg)   LMP 05/27/2024   SpO2 100%   BMI 29.58 kg/m   Physical Exam      Assessment & Plan:   Obesity (BMI 30-39.9) -     VITAMIN D  25 Hydroxy (Vit-D Deficiency, Fractures); Future  Vitamin D  deficiency -     TSH; Future -     Hemoglobin A1c; Future -     Lipid panel; Future -     Comprehensive metabolic panel with GFR; Future -     CBC with Differential/Platelet; Future  Atopic dermatitis, unspecified type -     Eucrisa ; Apply a thin layer to the affected areas of eczema twice daily  Dispense: 60 g; Refill: 2  Acne vulgaris Assessment & Plan: Mild facial acne with whiteheads, improved with current face wash. - Prescribed benzoyl peroxide - clindamycin  gel for morning use. - Prescribed tretinoin  A 0.025% cream for nighttime use. - Advised use of non-comedogenic SPF moisturizer. Recommended brands such as The  Ordinary or Byoma.  - Instructed to continue current face wash if effective. - Scheduled follow-up in 12 weeks to assess treatment efficacy.  Orders: -     Clindamycin  Phos-Benzoyl Perox; Apply topically in the morning.  Dispense: 25 g; Refill: 0 -     Tretinoin ; Apply topically at bedtime.  Dispense: 45 g; Refill: 0  HSV-1 infection Assessment & Plan: History of cold dores managed with as needed Valtex for flares. She has very rare outbreaks. Will send in a refill of valtrex     Eczema, unspecified type Assessment & Plan: Chronic. Has tried and failed triamcinolone cream and OTC hydrocortisone cream. Experienced withdrawal skin side effects after stopping triamcinolone. Little improvement in appearance or symptoms. Discussed nonsteroidal treatment options such as Eucrisa  or tacrolimus. Insurance will cover Eucrisa  with prior auth and proof of step wise treatment. Prescribed Eucrisa  BID for better eczema control. Follow up in 3 months to reassess.   Pure hypercholesterolemia Assessment & Plan: Checking CMP and lipid panel with labs.    Other orders -     valACYclovir  HCl; Take 2 tablets (2,000 mg total) by mouth 2 (two) times daily for 1 day as needed for cold sore outbreak.  Dispense: 12 tablet; Refill: 1     Return in about 3 months (around 09/21/2024) for Acne, atopic dermatitis .   Saddie JULIANNA Sacks, PA-C  "

## 2024-06-24 ENCOUNTER — Other Ambulatory Visit (HOSPITAL_COMMUNITY): Payer: Self-pay

## 2024-06-24 ENCOUNTER — Telehealth (HOSPITAL_COMMUNITY): Payer: Self-pay

## 2024-06-24 NOTE — Telephone Encounter (Signed)
 Pharmacy Patient Advocate Encounter   Received notification from Pt Calls Messages that prior authorization for Eucrisa  2% ointment  is required/requested.   Insurance verification completed.   The patient is insured through [facility]   Per test claim: PA required; PA submitted to above mentioned insurance via Latent Key/confirmation #/EOC [id number] Status is pending

## 2024-06-24 NOTE — Telephone Encounter (Signed)
 PA request has been Received. New Encounter has been or will be created for follow up. For additional info see Pharmacy Prior Auth telephone encounter from 06/24/24.

## 2024-06-29 ENCOUNTER — Other Ambulatory Visit (HOSPITAL_COMMUNITY): Payer: Self-pay

## 2024-06-29 NOTE — Telephone Encounter (Signed)
 Pharmacy Patient Advocate Encounter  Received notification from [employer] that Prior Authorization for Eucrisa  2% ointment  has been APPROVED from 06/29/24 to 09/27/24. Ran test claim, Copay is $10 with copay card. This test claim was processed through [facility]- copay amounts may vary at other pharmacies due to pharmacy/plan contracts, or as the patient moves through the different stages of their insurance plan.   PA #/Case ID/Reference #: [id number]

## 2024-07-05 ENCOUNTER — Other Ambulatory Visit (HOSPITAL_COMMUNITY): Payer: Self-pay

## 2024-07-06 ENCOUNTER — Ambulatory Visit: Payer: Self-pay

## 2024-07-06 LAB — COMPREHENSIVE METABOLIC PANEL WITH GFR
ALT: 12 [IU]/L (ref 0–32)
AST: 15 [IU]/L (ref 0–40)
Albumin: 4.7 g/dL (ref 4.0–5.0)
Alkaline Phosphatase: 81 [IU]/L (ref 41–116)
BUN/Creatinine Ratio: 14 (ref 9–23)
BUN: 10 mg/dL (ref 6–20)
Bilirubin Total: 0.4 mg/dL (ref 0.0–1.2)
CO2: 20 mmol/L (ref 20–29)
Calcium: 10.2 mg/dL (ref 8.7–10.2)
Chloride: 105 mmol/L (ref 96–106)
Creatinine, Ser: 0.71 mg/dL (ref 0.57–1.00)
Globulin, Total: 3.5 g/dL (ref 1.5–4.5)
Glucose: 84 mg/dL (ref 70–99)
Potassium: 5.3 mmol/L — ABNORMAL HIGH (ref 3.5–5.2)
Sodium: 140 mmol/L (ref 134–144)
Total Protein: 8.2 g/dL (ref 6.0–8.5)
eGFR: 122 mL/min/{1.73_m2}

## 2024-07-06 LAB — CBC WITH DIFFERENTIAL/PLATELET
Basophils Absolute: 0.1 10*3/uL (ref 0.0–0.2)
Basos: 1 %
EOS (ABSOLUTE): 0.1 10*3/uL (ref 0.0–0.4)
Eos: 2 %
Hematocrit: 39.2 % (ref 34.0–46.6)
Hemoglobin: 13 g/dL (ref 11.1–15.9)
Immature Grans (Abs): 0 10*3/uL (ref 0.0–0.1)
Immature Granulocytes: 0 %
Lymphocytes Absolute: 2.2 10*3/uL (ref 0.7–3.1)
Lymphs: 41 %
MCH: 29.3 pg (ref 26.6–33.0)
MCHC: 33.2 g/dL (ref 31.5–35.7)
MCV: 88 fL (ref 79–97)
Monocytes Absolute: 0.5 10*3/uL (ref 0.1–0.9)
Monocytes: 10 %
Neutrophils Absolute: 2.4 10*3/uL (ref 1.4–7.0)
Neutrophils: 46 %
Platelets: 356 10*3/uL (ref 150–450)
RBC: 4.44 x10E6/uL (ref 3.77–5.28)
RDW: 11.7 % (ref 11.7–15.4)
WBC: 5.2 10*3/uL (ref 3.4–10.8)

## 2024-07-06 LAB — HEMOGLOBIN A1C
Est. average glucose Bld gHb Est-mCnc: 100 mg/dL
Hgb A1c MFr Bld: 5.1 % (ref 4.8–5.6)

## 2024-07-06 LAB — LIPID PANEL
Chol/HDL Ratio: 3.4 ratio (ref 0.0–4.4)
Cholesterol, Total: 202 mg/dL — ABNORMAL HIGH (ref 100–199)
HDL: 59 mg/dL
LDL Chol Calc (NIH): 131 mg/dL — ABNORMAL HIGH (ref 0–99)
Triglycerides: 66 mg/dL (ref 0–149)
VLDL Cholesterol Cal: 12 mg/dL (ref 5–40)

## 2024-07-06 LAB — VITAMIN D 25 HYDROXY (VIT D DEFICIENCY, FRACTURES): Vit D, 25-Hydroxy: 19.1 ng/mL — ABNORMAL LOW (ref 30.0–100.0)

## 2024-07-06 LAB — TSH: TSH: 1.4 u[IU]/mL (ref 0.450–4.500)

## 2024-07-11 ENCOUNTER — Other Ambulatory Visit (HOSPITAL_COMMUNITY): Payer: Self-pay

## 2024-07-13 ENCOUNTER — Other Ambulatory Visit (HOSPITAL_COMMUNITY): Payer: Self-pay

## 2024-07-13 DIAGNOSIS — L309 Dermatitis, unspecified: Secondary | ICD-10-CM

## 2024-07-14 ENCOUNTER — Other Ambulatory Visit (HOSPITAL_COMMUNITY): Payer: Self-pay

## 2024-07-14 MED ORDER — PIMECROLIMUS 1 % EX CREA
TOPICAL_CREAM | Freq: Two times a day (BID) | CUTANEOUS | 0 refills | Status: AC
  Filled 2024-07-14 – 2024-07-15 (×2): qty 30, 30d supply, fill #0

## 2024-07-15 ENCOUNTER — Other Ambulatory Visit (HOSPITAL_COMMUNITY): Payer: Self-pay

## 2024-08-19 ENCOUNTER — Other Ambulatory Visit (HOSPITAL_COMMUNITY)

## 2024-09-21 ENCOUNTER — Ambulatory Visit

## 2024-10-21 ENCOUNTER — Encounter: Admitting: Nurse Practitioner
